# Patient Record
Sex: Female | Born: 1990 | Race: Black or African American | Hispanic: No | Marital: Single | Smoking: Never smoker
Health system: Southern US, Community
[De-identification: ages and names within clinical notes are randomized; demographics above are authoritative.]

## PROBLEM LIST (undated history)

## (undated) DIAGNOSIS — I456 Pre-excitation syndrome: Secondary | ICD-10-CM

---

## 2012-02-16 HISTORY — PX: WISDOM TOOTH EXTRACTION: SHX21

## 2018-01-09 ENCOUNTER — Telehealth: Payer: Self-pay

## 2018-01-09 NOTE — Telephone Encounter (Signed)
SENT REFERRAL TO SCHEDULING AND FILED NOTES 

## 2018-01-18 ENCOUNTER — Ambulatory Visit: Payer: PPO | Admitting: Cardiology

## 2018-01-18 ENCOUNTER — Encounter: Payer: Self-pay | Admitting: Cardiology

## 2018-01-18 VITALS — BP 122/66 | HR 78 | Ht 66.0 in | Wt 174.0 lb

## 2018-01-18 DIAGNOSIS — R011 Cardiac murmur, unspecified: Secondary | ICD-10-CM

## 2018-01-18 DIAGNOSIS — Z1329 Encounter for screening for other suspected endocrine disorder: Secondary | ICD-10-CM

## 2018-01-18 DIAGNOSIS — R079 Chest pain, unspecified: Secondary | ICD-10-CM

## 2018-01-18 DIAGNOSIS — I456 Pre-excitation syndrome: Secondary | ICD-10-CM | POA: Diagnosis not present

## 2018-01-18 DIAGNOSIS — Z13220 Encounter for screening for lipoid disorders: Secondary | ICD-10-CM

## 2018-01-18 NOTE — Patient Instructions (Signed)
Medication Instructions:  Your physician recommends that you continue on your current medications as directed. Please refer to the Current Medication list given to you today.  If you need a refill on your cardiac medications before your next appointment, please call your pharmacy.   Lab work: Your physician recommends that you have the following labs drawn: BMP, CBC, TSH, liver and lipid panel to be done today.  If you have labs (blood work) drawn today and your tests are completely normal, you will receive your results only by: Marland Kitchen. MyChart Message (if you have MyChart) OR . A paper copy in the mail If you have any lab test that is abnormal or we need to change your treatment, we will call you to review the results.  Testing/Procedures: Your physician has requested that you have an echocardiogram. Echocardiography is a painless test that uses sound waves to create images of your heart. It provides your doctor with information about the size and shape of your heart and how well your heart's chambers and valves are working. This procedure takes approximately one hour. There are no restrictions for this procedure.  Your physician has requested that you have an exercise tolerance test. For further information please visit [email or web address]. Please also follow instruction sheet, as given.  Follow-Up: At [facility], you and your health needs are our priority.  As part of our continuing mission to provide you with exceptional heart care, we have created designated Provider Care Teams.  These Care Teams include your primary Cardiologist (physician) and Advanced Practice Providers (APPs -  Physician Assistants and Nurse Practitioners) who all work together to provide you with the care you need, when you need it.  You will need a follow up appointment in 6 months.  Please call our office 2 months in advance to schedule this appointment.  You may see another member of our [facility] [place] Team  [place]: Gypsy Balsamobert Krasowski, MD . Norman HerrlichBrian Munley, MD  Any Other Special Instructions Will Be Listed Below (If Applicable).

## 2018-01-18 NOTE — Addendum Note (Signed)
Addended by: Carren RangGIPSON, Jvion Turgeon on: 01/18/2018 11:32 AM   Modules accepted: Orders

## 2018-01-18 NOTE — Progress Notes (Signed)
Cardiology Office Note:    Date:  01/18/2018   ID:  Kaitlin Kim, DOB 1990-11-07, MRN [id number]  PCP:  Patient, No Pcp Per  Cardiologist:  Garwin Brothersajan R , MD   Referring MD: Princella Pellegrinieed, Justin, PA    ASSESSMENT:    1. Chest pain, unspecified type   2. Cardiac murmur   3. Wolff-Parkinson-White (WPW) pattern seen on electrocardiography    PLAN:    In order of problems listed above:  1. I discussed my findings with the patient at extensive length and reassured her. 2. In view of her significant symptoms we will do a exercise treadmill test to reassure her.  Echocardiogram will be done to assess murmur heard on auscultation. 3. I discussed with her that she has a delta wave on her EKG suggestive of Wolff-Parkinson-White type of an EKG.  She has never had palpitations and I asked her about this extensively and I am convinced that she has had no issues with tachyarrhythmias based on her history. 4. Patient will be seen in follow-up appointment in 6 months or earlier if the patient has any concerns 5. She knows to go to the nearest emergency room for any concerning symptoms.   Medication Adjustments/Labs and Tests Ordered: Current medicines are reviewed at length with the patient today.  Concerns regarding medicines are outlined above.  No orders of the defined types were placed in this encounter.  No orders of the defined types were placed in this encounter.    History of Present Illness:    Kaitlin FearsRebekah Lai is a 27 y.o. female who is being seen today for the evaluation of chest pain at the request of Princella PellegriniReed, Justin, [place].  Patient is a pleasant 27 year old female.  She has no significant past medical history.  She mentions to me that she has had an episode of chest pain while at church.  She says she was just sitting down and not doing much and had a significant episode of chest pain no radiation to the neck or to the arms no shortness of breath she went to the urgent care center and was  evaluated and released.  I am awaiting those records from that place.  No orthopnea or PND.  She is an active lady.  She does not exercise on a regular basis.  With activities of routine living she has no symptoms.  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  History reviewed. No pertinent past medical history.  Past Surgical History:  Procedure Laterality Date  . WISDOM TOOTH EXTRACTION  2014    Current Medications: Current Meds  Medication Sig  . buPROPion (WELLBUTRIN XL) 150 MG 24 hr tablet Take 1 tablet by mouth daily.  . Hydroxyquinoline Sulfate POWD Apply 4 % topically as needed.  . Multiple Vitamin (MULTIVITAMIN) capsule Take 1 capsule by mouth daily.  . Omega-3 Fatty Acids (OMEGA-3 FISH OIL PO) Take 2 capsules by mouth daily.  Marland Kitchen. tretinoin (RETIN-A) 0.05 % cream Apply 1 application topically as needed.     Allergies:   Patient has no known allergies.   Social History   Socioeconomic History  . Marital status: Single    Spouse name: Not on file  . Number of children: Not on file  . Years of education: Not on file  . Highest education level: Not on file  Occupational History  . Not on file  Social Needs  . Financial resource strain: Not on file  . Food insecurity:    Worry: Not  on file    Inability: Not on file  . Transportation needs:    Medical: Not on file    Non-medical: Not on file  Tobacco Use  . Smoking status: Never Smoker  . Smokeless tobacco: Never Used  Substance and Sexual Activity  . Alcohol use: Not on file  . Drug use: Not on file  . Sexual activity: Not on file  Lifestyle  . Physical activity:    Days per week: Not on file    Minutes per session: Not on file  . Stress: Not on file  Relationships  . Social connections:    Talks on phone: Not on file    Gets together: Not on file    Attends religious service: Not on file    Active member of club or organization: Not on file    Attends meetings of clubs or organizations: Not on file      Relationship status: Not on file  Other Topics Concern  . Not on file  Social History Narrative  . Not on file     Family History: The patient's family history includes Prostate cancer in her paternal grandfather.  ROS:   Please see the history of present illness.    All other systems reviewed and are negative.  EKGs/Labs/Other Studies Reviewed:    The following studies were reviewed today: EKG reveals sinus rhythm with delta wave suggestive of WPW.   Recent Labs: No results found for requested labs within last 8760 hours.  Recent Lipid Panel No results found for: CHOL, TRIG, HDL, CHOLHDL, VLDL, LDLCALC, LDLDIRECT  Physical Exam:    VS:  BP 122/66 (BP Location: Right Arm, Patient Position: Sitting, Cuff Size: Normal)   Pulse 78   Ht 5\' 6"  (1.676 m)   Wt 174 lb (78.9 kg)   SpO2 97%   BMI 28.08 kg/m     Wt Readings from Last 3 Encounters:  01/18/18 174 lb (78.9 kg)     GEN: Patient is in no acute distress HEENT: Normal NECK: No JVD; No carotid bruits LYMPHATICS: No lymphadenopathy CARDIAC: S1 S2 regular, 2/6 systolic murmur at the apex. RESPIRATORY:  Clear to auscultation without rales, wheezing or rhonchi  ABDOMEN: Soft, non-tender, non-distended MUSCULOSKELETAL:  No edema; No deformity  SKIN: Warm and dry NEUROLOGIC:  Alert and oriented x 3 PSYCHIATRIC:  Normal affect    Signed, Garwin Brothers, MD  01/18/2018 10:53 AM    [facility]

## 2018-01-19 ENCOUNTER — Telehealth: Payer: Self-pay

## 2018-01-19 LAB — LIPID PANEL
Chol/HDL Ratio: 3 ratio (ref 0.0–4.4)
Cholesterol, Total: 171 mg/dL (ref 100–199)
HDL: 57 mg/dL (ref >39)
LDL CALC: 104 mg/dL — AB (ref 0–99)
Triglycerides: 52 mg/dL (ref 0–149)
VLDL Cholesterol Cal: 10 mg/dL (ref 5–40)

## 2018-01-19 LAB — BASIC METABOLIC PANEL
BUN/Creatinine Ratio: 16 (ref 9–23)
BUN: 13 mg/dL (ref 6–20)
CO2: 22 mmol/L (ref 20–29)
Calcium: 9.8 mg/dL (ref 8.7–10.2)
Chloride: 104 mmol/L (ref 96–106)
Creatinine, Ser: 0.79 mg/dL (ref 0.57–1.00)
GFR calc Af Amer: 119 mL/min/{1.73_m2} (ref >59)
GFR calc non Af Amer: 104 mL/min/{1.73_m2} (ref >59)
GLUCOSE: 82 mg/dL (ref 65–99)
Potassium: 4.2 mmol/L (ref 3.5–5.2)
Sodium: 138 mmol/L (ref 134–144)

## 2018-01-19 LAB — CBC WITH DIFFERENTIAL/PLATELET
Basophils Absolute: 0 10*3/uL (ref 0.0–0.2)
Basos: 1 %
EOS (ABSOLUTE): 0.1 10*3/uL (ref 0.0–0.4)
Eos: 2 %
HEMATOCRIT: 39 % (ref 34.0–46.6)
HEMOGLOBIN: 12.2 g/dL (ref 11.1–15.9)
Immature Grans (Abs): 0 10*3/uL (ref 0.0–0.1)
Immature Granulocytes: 0 %
Lymphocytes Absolute: 1.9 10*3/uL (ref 0.7–3.1)
Lymphs: 48 %
MCH: 22.8 pg — ABNORMAL LOW (ref 26.6–33.0)
MCHC: 31.3 g/dL — ABNORMAL LOW (ref 31.5–35.7)
MCV: 73 fL — ABNORMAL LOW (ref 79–97)
Monocytes Absolute: 0.3 10*3/uL (ref 0.1–0.9)
Monocytes: 8 %
Neutrophils Absolute: 1.6 10*3/uL (ref 1.4–7.0)
Neutrophils: 41 %
Platelets: 263 10*3/uL (ref 150–450)
RBC: 5.35 x10E6/uL — ABNORMAL HIGH (ref 3.77–5.28)
RDW: 13.8 % (ref 12.3–15.4)
WBC: 3.9 10*3/uL (ref 3.4–10.8)

## 2018-01-19 LAB — TSH: TSH: 1.39 u[IU]/mL (ref 0.450–4.500)

## 2018-01-19 LAB — HEPATIC FUNCTION PANEL
ALT: 17 IU/L (ref 0–32)
AST: 19 IU/L (ref 0–40)
Albumin: 4.5 g/dL (ref 3.5–5.5)
Alkaline Phosphatase: 51 IU/L (ref 39–117)
BILIRUBIN, DIRECT: 0.07 mg/dL (ref 0.00–0.40)
Bilirubin Total: 0.2 mg/dL (ref 0.0–1.2)
Total Protein: 7.6 g/dL (ref 6.0–8.5)

## 2018-01-19 NOTE — Telephone Encounter (Signed)
Called patient and left detailed voice message on phone regarding lab results. 

## 2018-02-02 ENCOUNTER — Ambulatory Visit (INDEPENDENT_AMBULATORY_CARE_PROVIDER_SITE_OTHER): Payer: PPO

## 2018-02-02 ENCOUNTER — Ambulatory Visit (HOSPITAL_COMMUNITY): Payer: PPO | Attending: Cardiology

## 2018-02-02 DIAGNOSIS — I456 Pre-excitation syndrome: Secondary | ICD-10-CM | POA: Insufficient documentation

## 2018-02-02 DIAGNOSIS — R079 Chest pain, unspecified: Secondary | ICD-10-CM | POA: Insufficient documentation

## 2018-02-02 DIAGNOSIS — R011 Cardiac murmur, unspecified: Secondary | ICD-10-CM

## 2018-02-02 LAB — EXERCISE TOLERANCE TEST
CSEPED: 9 min
Estimated workload: 10.8 METS
Exercise duration (sec): 30 s
MPHR: 194 {beats}/min
Peak HR: 179 {beats}/min
Percent HR: 92 %
RPE: 179
Rest HR: 92 {beats}/min

## 2018-02-02 MED ORDER — PERFLUTREN LIPID MICROSPHERE
1.0000 mL | INTRAVENOUS | Status: AC | PRN
  Administered 2018-02-02: 2 mL via INTRAVENOUS

## 2018-02-03 ENCOUNTER — Telehealth: Payer: Self-pay

## 2018-02-03 NOTE — Telephone Encounter (Signed)
Patient called and notified of test results. 

## 2018-02-03 NOTE — Telephone Encounter (Signed)
-----   Message from Rajan R Revankar, MD sent at 02/03/2018  8:18 AM EST ----- The results of the study is unremarkable. Please inform patient. I will discuss in detail at next appointment. Cc  primary care/referring physician Rajan R Revankar, MD 02/03/2018 8:18 AM 

## 2018-06-02 ENCOUNTER — Telehealth: Payer: Self-pay | Admitting: Cardiology

## 2018-06-02 NOTE — Telephone Encounter (Signed)
Pt called [employer] Dept,  because she got a bill from her lab visit. After contacting her insurance company, the [employer] advised her to contact the office to have the coding changed. Once the coding was changed, the bill would be paid by the insurance company  [employer] can be reached at [phone] M-F Ref# [id number] Reverification of Diagnosis

## 2018-06-05 NOTE — Telephone Encounter (Signed)
4/20-called pt,lvm to call [phone]

## 2018-06-12 ENCOUNTER — Telehealth: Payer: Self-pay

## 2018-06-12 NOTE — Telephone Encounter (Signed)
YOUR CARDIOLOGY TEAM HAS ARRANGED FOR AN E-VISIT FOR YOUR APPOINTMENT - PLEASE REVIEW IMPORTANT INFORMATION BELOW SEVERAL DAYS PRIOR TO YOUR APPOINTMENT  Due to the recent COVID-19 pandemic, we are transitioning in-person office visits to tele-medicine visits in an effort to decrease unnecessary exposure to our patients, their families, and staff. These visits are billed to your insurance just like a normal visit is. We also encourage you to sign up for MyChart if you have not already done so. You will need a smartphone if possible. For patients that do not have this, we can still complete the visit using a regular telephone but do prefer a smartphone to enable video when possible. You may have a family member that lives with you that can help. If possible, we also ask that you have a blood pressure cuff and scale at home to measure your blood pressure, heart rate and weight prior to your scheduled appointment. Patients with clinical needs that need an in-person evaluation and testing will still be able to come to the office if absolutely necessary. If you have any questions, feel free to call our office.   YOUR PROVIDER WILL BE USING THE FOLLOWING PLATFORM TO COMPLETE YOUR VISIT: Doxy.Me   . IF USING DOXIMITY or DOXY.ME - The staff will give you instructions on receiving your link to join the meeting the day of your visit.    2-3 DAYS BEFORE YOUR APPOINTMENT  You will receive a telephone call from one of our [facility] team members - your caller ID may say "Unknown caller." If this is a video visit, we will walk you through how to get the video launched on your phone. We will remind you check your blood pressure, heart rate and weight prior to your scheduled appointment. If you have an Apple Watch or Kardia, please upload any pertinent ECG strips the day before or morning of your appointment to MyChart. Our staff will also make sure you have reviewed the consent and agree to move forward with your  scheduled tele-health visit.    THE DAY OF YOUR APPOINTMENT  Approximately 15 minutes prior to your scheduled appointment, you will receive a telephone call from one of HeartCare team - your caller ID may say "Unknown caller."  Our staff will confirm medications, vital signs for the day and any symptoms you may be experiencing. Please have this information available prior to the time of visit start. It may also be helpful for you to have a pad of paper and pen handy for any instructions given during your visit. They will also walk you through joining the smartphone meeting if this is a video visit.    CONSENT FOR TELE-HEALTH VISIT - PLEASE REVIEW  I hereby voluntarily request, consent and authorize [facility] and its employed or contracted physicians, physician assistants, nurse practitioners or other licensed health care professionals (the Practitioner), to provide me with telemedicine health care services (the "Services") as deemed necessary by the treating Practitioner. I acknowledge and consent to receive the Services by the Practitioner via telemedicine. I understand that the telemedicine visit will involve communicating with the Practitioner through live audiovisual communication technology and the disclosure of certain medical information by electronic transmission. I acknowledge that I have been given the opportunity to request an in-person assessment or other available alternative prior to the telemedicine visit and am voluntarily participating in the telemedicine visit.  I understand that I have the right to withhold or withdraw my consent to the use of telemedicine in   the course of my care at any time, without affecting my right to future care or treatment, and that the Practitioner or I may terminate the telemedicine visit at any time. I understand that I have the right to inspect all information obtained and/or recorded in the course of the telemedicine visit and may receive copies of  available information for a reasonable fee.  I understand that some of the potential risks of receiving the Services via telemedicine include:  . Delay or interruption in medical evaluation due to technological equipment failure or disruption; . Information transmitted may not be sufficient (e.g. poor resolution of images) to allow for appropriate medical decision making by the Practitioner; and/or  . In rare instances, security protocols could fail, causing a breach of personal health information.  Furthermore, I acknowledge that it is my responsibility to provide information about my medical history, conditions and care that is complete and accurate to the best of my ability. I acknowledge that Practitioner's advice, recommendations, and/or decision may be based on factors not within their control, such as incomplete or inaccurate data provided by me or distortions of diagnostic images or specimens that may result from electronic transmissions. I understand that the practice of medicine is not an exact science and that Practitioner makes no warranties or guarantees regarding treatment outcomes. I acknowledge that I will receive a copy of this consent concurrently upon execution via email to the email address I last provided but may also request a printed copy by calling the office of [facility].    I understand that my insurance will be billed for this visit.   I have read or had this consent read to me. . I understand the contents of this consent, which adequately explains the benefits and risks of the Services being provided via telemedicine.  . I have been provided ample opportunity to ask questions regarding this consent and the Services and have had my questions answered to my satisfaction. . I give my informed consent for the services to be provided through the use of telemedicine in my medical care  By participating in this telemedicine visit I agree to the above.  

## 2018-06-13 ENCOUNTER — Telehealth: Payer: Self-pay | Admitting: Cardiology

## 2018-06-13 NOTE — Telephone Encounter (Signed)
This patient has WPW on EKG and otherwise no evidence of heart failure, structural heart disease or CAD.  If she would like to switch providers she would be more appropriate for EP than me.

## 2018-06-13 NOTE — Telephone Encounter (Signed)
Dr.Revankar please advise?

## 2018-06-13 NOTE — Telephone Encounter (Signed)
Dr  Duke Salvia please advise?

## 2018-06-13 NOTE — Telephone Encounter (Signed)
  Patient would like to go from Dr Tomie China to start seeing Dr Duke Salvia

## 2018-06-14 NOTE — Telephone Encounter (Signed)
Left message to call back  

## 2018-06-14 NOTE — Telephone Encounter (Signed)
Agree. EP would be better

## 2018-06-15 NOTE — Telephone Encounter (Signed)
° °  Patient returning call. EP appointment needed. Patient advised she would be contacted to schedule EP appointment

## 2018-06-16 ENCOUNTER — Telehealth: Payer: PPO | Admitting: Cardiology

## 2018-06-21 ENCOUNTER — Telehealth: Payer: Self-pay | Admitting: *Deleted

## 2018-06-21 NOTE — Telephone Encounter (Signed)
Virtual Visit Pre-Appointment Phone Call  "(Name), I am calling you today to discuss your upcoming appointment. We are currently trying to limit exposure to the virus that causes COVID-19 by seeing patients at home rather than in the office."  1. "What is the BEST phone number to call the day of the visit?" - include this in appointment notes  2. "Do you have or have access to (through a family member/friend) a smartphone with video capability that we can use for your visit?" a. If yes - list this number in appt notes as "cell" (if different from BEST phone #) and list the appointment type as a VIDEO visit in appointment notes b. If no - list the appointment type as a PHONE visit in appointment notes  3. Confirm consent - "In the setting of the current Covid19 crisis, you are scheduled for a (phone or video) visit with your provider on (date) at (time).  Just as we do with many in-office visits, in order for you to participate in this visit, we must obtain consent.  If you'd like, I can send this to your mychart (if signed up) or email for you to review.  Otherwise, I can obtain your verbal consent now.  All virtual visits are billed to your insurance company just like a normal visit would be.  By agreeing to a virtual visit, we'd like you to understand that the technology does not allow for your provider to perform an examination, and thus may limit your provider's ability to fully assess your condition. If your provider identifies any concerns that need to be evaluated in person, we will make arrangements to do so.  Finally, though the technology is pretty good, we cannot assure that it will always work on either your or our end, and in the setting of a video visit, we may have to convert it to a phone-only visit.  In either situation, we cannot ensure that we have a secure connection.  Are you willing to proceed?" STAFF: Did the patient verbally acknowledge consent to telehealth visit? Document  YES/NO here: YES  4. Advise patient to be prepared - "Two hours prior to your appointment, go ahead and check your blood pressure, pulse, oxygen saturation, and your weight (if you have the equipment to check those) and write them all down. When your visit starts, your provider will ask you for this information. If you have an Apple Watch or Kardia device, please plan to have heart rate information ready on the day of your appointment. Please have a pen and paper handy nearby the day of the visit as well."  5. Give patient instructions for MyChart download to smartphone OR Doximity/Doxy.me as below if video visit (depending on what platform provider is using)  6. Inform patient they will receive a phone call 15 minutes prior to their appointment time (may be from unknown caller ID) so they should be prepared to answer    TELEPHONE CALL NOTE  Kaitlin Kim has been deemed a candidate for a follow-up tele-health visit to limit community exposure during the Covid-19 pandemic. I spoke with the patient via phone to ensure availability of phone/video source, confirm preferred email & phone number, and discuss instructions and expectations.  I reminded Kaitlin Kim to be prepared with any vital sign and/or heart rhythm information that could potentially be obtained via home monitoring, at the time of her visit. I reminded Kaitlin Kim to expect a phone call prior to her visit.  Baird Lyons, RN 06/21/2018 3:45 PM   INSTRUCTIONS FOR DOWNLOADING THE MYCHART APP TO SMARTPHONE  - The patient must first make sure to have activated MyChart and know their login information - If Apple, go to [employer] and type in MyChart in the search bar and download the app. If Android, ask patient to go to [employer] and type in [place] in the search bar and download the app. The app is free but as with any other app downloads, their phone may require them to verify saved payment information or  Apple/Android password.  - The patient will need to then log into the app with their MyChart username and password, and select [facility] as their healthcare provider to link the account. When it is time for your visit, go to the MyChart app, find appointments, and click Begin Video Visit. Be sure to Select Allow for your device to access the Microphone and Camera for your visit. You will then be connected, and your provider will be with you shortly.  **If they have any issues connecting, or need assistance please contact MyChart service desk [phone])**  **If using a computer, in order to ensure the best quality for their visit they will need to use either of the following Internet Browsers: [employer] or Google Chrome**  IF USING DOXIMITY or DOXY.ME - The patient will receive a link just prior to their visit by text.     FULL LENGTH CONSENT FOR TELE-HEALTH VISIT   I hereby voluntarily request, consent and authorize [facility] and its employed or contracted physicians, physician assistants, nurse practitioners or other licensed health care professionals (the Practitioner), to provide me with telemedicine health care services (the "Services") as deemed necessary by the treating Practitioner. I acknowledge and consent to receive the Services by the Practitioner via telemedicine. I understand that the telemedicine visit will involve communicating with the Practitioner through live audiovisual communication technology and the disclosure of certain medical information by electronic transmission. I acknowledge that I have been given the opportunity to request an in-person assessment or other available alternative prior to the telemedicine visit and am voluntarily participating in the telemedicine visit.  I understand that I have the right to withhold or withdraw my consent to the use of telemedicine in the course of my care at any time, without affecting my right to future care  or treatment, and that the Practitioner or I may terminate the telemedicine visit at any time. I understand that I have the right to inspect all information obtained and/or recorded in the course of the telemedicine visit and may receive copies of available information for a reasonable fee.  I understand that some of the potential risks of receiving the Services via telemedicine include:  Marland Kitchen Delay or interruption in medical evaluation due to technological equipment failure or disruption; . Information transmitted may not be sufficient (e.g. poor resolution of images) to allow for appropriate medical decision making by the Practitioner; and/or  . In rare instances, security protocols could fail, causing a breach of personal health information.  Furthermore, I acknowledge that it is my responsibility to provide information about my medical history, conditions and care that is complete and accurate to the best of my ability. I acknowledge that Practitioner's advice, recommendations, and/or decision may be based on factors not within their control, such as incomplete or inaccurate data provided by me or distortions of diagnostic images or specimens that may result from electronic transmissions. I understand that  the practice of medicine is not an exact science and that Practitioner makes no warranties or guarantees regarding treatment outcomes. I acknowledge that I will receive a copy of this consent concurrently upon execution via email to the email address I last provided but may also request a printed copy by calling the office of [place].    I understand that my insurance will be billed for this visit.   I have read or had this consent read to me. . I understand the contents of this consent, which adequately explains the benefits and risks of the Services being provided via telemedicine.  . I have been provided ample opportunity to ask questions regarding this consent and the Services and have had  my questions answered to my satisfaction. . I give my informed consent for the services to be provided through the use of telemedicine in my medical care  By participating in this telemedicine visit I agree to the above.

## 2018-06-21 NOTE — Telephone Encounter (Signed)
Patient has appointment tomorrow with EP

## 2018-06-22 ENCOUNTER — Telehealth: Payer: Self-pay | Admitting: Cardiology

## 2018-06-22 ENCOUNTER — Encounter: Payer: PPO | Admitting: Cardiology

## 2018-06-22 ENCOUNTER — Other Ambulatory Visit: Payer: Self-pay

## 2018-06-22 NOTE — Progress Notes (Signed)
This encounter was created in error - please disregard.

## 2018-06-22 NOTE — Telephone Encounter (Signed)
Rodman Key, scheduler, informed pt at 4:10 pm that I would contact her next week to reschedule.

## 2018-06-22 NOTE — Telephone Encounter (Signed)
New Message    Pt said she has called twice now and her appt was over an hour ago and still has not gotten a call back    Please call

## 2018-06-23 ENCOUNTER — Emergency Department (HOSPITAL_COMMUNITY)
Admission: EM | Admit: 2018-06-23 | Discharge: 2018-06-23 | Disposition: A | Discharge status: Home / Self Care | Payer: PPO | Attending: Emergency Medicine | Admitting: Emergency Medicine

## 2018-06-23 ENCOUNTER — Other Ambulatory Visit: Payer: Self-pay

## 2018-06-23 ENCOUNTER — Encounter (HOSPITAL_COMMUNITY): Payer: Self-pay | Admitting: Emergency Medicine

## 2018-06-23 ENCOUNTER — Emergency Department (HOSPITAL_COMMUNITY): Payer: PPO

## 2018-06-23 DIAGNOSIS — Z79899 Other long term (current) drug therapy: Secondary | ICD-10-CM | POA: Diagnosis not present

## 2018-06-23 DIAGNOSIS — I456 Pre-excitation syndrome: Secondary | ICD-10-CM | POA: Diagnosis not present

## 2018-06-23 DIAGNOSIS — R079 Chest pain, unspecified: Secondary | ICD-10-CM

## 2018-06-23 LAB — CBC
HCT: 39.2 % (ref 36.0–46.0)
Hemoglobin: 12.3 g/dL (ref 12.0–15.0)
MCH: 23.1 pg — ABNORMAL LOW (ref 26.0–34.0)
MCHC: 31.4 g/dL (ref 30.0–36.0)
MCV: 73.5 fL — ABNORMAL LOW (ref 80.0–100.0)
Platelets: 212 10*3/uL (ref 150–400)
RBC: 5.33 MIL/uL — ABNORMAL HIGH (ref 3.87–5.11)
RDW: 13.8 % (ref 11.5–15.5)
WBC: 7.1 10*3/uL (ref 4.0–10.5)
nRBC: 0 % (ref 0.0–0.2)

## 2018-06-23 LAB — BASIC METABOLIC PANEL
Anion gap: 10 (ref 5–15)
BUN: 10 mg/dL (ref 6–20)
CO2: 26 mmol/L (ref 22–32)
Calcium: 9.5 mg/dL (ref 8.9–10.3)
Chloride: 101 mmol/L (ref 98–111)
Creatinine, Ser: 0.98 mg/dL (ref 0.44–1.00)
GFR calc Af Amer: >60 mL/min (ref >60)
GFR calc non Af Amer: >60 mL/min (ref >60)
Glucose, Bld: 88 mg/dL (ref 70–99)
Potassium: 3.3 mmol/L — ABNORMAL LOW (ref 3.5–5.1)
Sodium: 137 mmol/L (ref 135–145)

## 2018-06-23 LAB — I-STAT BETA HCG BLOOD, ED (MC, WL, AP ONLY): I-stat hCG, quantitative: <5 m[IU]/mL (ref <5)

## 2018-06-23 LAB — TROPONIN I: Troponin I: <0.03 ng/mL (ref <0.03)

## 2018-06-23 MED ORDER — SODIUM CHLORIDE 0.9% FLUSH: 3.0000 mL | Freq: Once | INTRAVENOUS | Status: DC

## 2018-06-23 NOTE — ED Provider Notes (Signed)
[facility] Provider Note   CSN: [id number] Arrival date & time: 06/23/18  1802    History   Chief Complaint Chief Complaint  Patient presents with  . Chest Pain    HPI Kaitlin Kim is a 28 y.o. 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 Patient states pain has been occurring since November 2019. Saw cardiology, EKG w/ concern for WPW, had echo & stress test that were reassuring in December 2019. States pain seemed to ease off some but never truly went away then seemed to worsen in March of 2020.  Pain is located to the L chest and seems to go to the L forearm at times, described as an aching sensation that waxes/wanes without alleviating/aggravating factors. No change w/ deep breath, positions, or exertion.  She mentions that she has had a couple episodes of palpitations, maybe 1/week, where it feels like her heart is beating quickly, she becomes anxious, she checks her Fitbit and has noted that her heart rate has been in the 150s during these episodes.  Episodes are brief in nature without alleviating or aggravating factors.  They have not been associated with lightheadedness, dizziness, or syncopal episode. States overall her symptoms have been worse over the past 1 week.  Denies fever, chills, cough, dyspnea, nausea, vomiting, diaphoresis, passing out, leg swelling, hemoptysis, recent surgery/trauma, recent long travel, hormone use, personal hx of cancer, or hx of DVT/PE.      HPI  No past medical history on file.  Patient Active Problem List   Diagnosis Date Noted  . Chest pain 01/18/2018  . Cardiac murmur 01/18/2018  . Wolff-Parkinson-White (WPW) pattern seen on electrocardiography 01/18/2018    Past Surgical History:  Procedure Laterality Date  . WISDOM TOOTH EXTRACTION  2014     OB History   No obstetric history on file.      Home Medications    Prior to Admission medications   Medication  Sig Start Date End Date Taking? Authorizing Provider  buPROPion (WELLBUTRIN XL) 150 MG 24 hr tablet Take 1 tablet by mouth daily. 09/12/17   [provider]  Hydroxyquinoline Sulfate POWD Apply 4 % topically as needed.    [provider]  Multiple Vitamin (MULTIVITAMIN) capsule Take 1 capsule by mouth daily.    [provider]  Omega-3 Fatty Acids (OMEGA-3 FISH OIL PO) Take 2 capsules by mouth daily.    [provider]  tretinoin (RETIN-A) 0.05 % cream Apply 1 application topically as needed.    [provider]    Family History Family History  Problem Relation Age of Onset  . Prostate cancer Paternal Grandfather     Social History Social History   Tobacco Use  . Smoking status: Never Smoker  . Smokeless tobacco: Never Used  Substance Use Topics  . Alcohol use: Yes    Comment: socially  . Drug use: Never     Allergies   Patient has no known allergies.   Review of Systems Review of Systems  Constitutional: Negative for chills and fever.  Respiratory: Negative for shortness of breath.   Cardiovascular: Positive for chest pain and palpitations. Negative for leg swelling.  Gastrointestinal: Negative for abdominal pain, nausea and vomiting.  Musculoskeletal: Positive for myalgias (L arm ).  Neurological: Negative for dizziness, syncope, weakness, light-headedness and numbness.  All other systems reviewed and are negative.  Physical Exam Updated Vital Signs BP 116/64 (BP Location: Right  Arm)   Pulse 81   Temp 98.2 F (36.8 C) (Oral)   Resp 20   Ht  (1.676 m)   Wt 80.3 kg   LMP 06/14/2018 (Approximate)   SpO2 99%   BMI 28.57 kg/m   Physical Exam Vitals signs and nursing note reviewed.  Constitutional:      General: She is not in acute distress.    Appearance: She is well-developed. She is not toxic-appearing.  HENT:     Head: Normocephalic and atraumatic.  Eyes:     General:        Right eye: No discharge.         Left eye: No discharge.     Conjunctiva/sclera: Conjunctivae normal.  Neck:     Musculoskeletal: Neck supple.  Cardiovascular:     Rate and Rhythm: Normal rate and regular rhythm.     Pulses:          Radial pulses are 2+ on the right side and 2+ on the left side.       Dorsalis pedis pulses are 2+ on the right side and 2+ on the left side.  Pulmonary:     Effort: Pulmonary effort is normal. No respiratory distress.     Breath sounds: Normal breath sounds. No wheezing, rhonchi or rales.  Chest:     Chest wall: No tenderness.  Abdominal:     General: There is no distension.     Palpations: Abdomen is soft.     Tenderness: There is no abdominal tenderness.  Musculoskeletal:     Right lower leg: She exhibits no tenderness. No edema.     Left lower leg: She exhibits no tenderness. No edema.  Skin:    General: Skin is warm and dry.     Capillary Refill: Capillary refill takes less than 2 seconds.     Findings: No rash.  Neurological:     General: No focal deficit present.     Mental Status: She is alert.     Comments: Clear speech.   Psychiatric:        Behavior: Behavior normal.    ED Treatments / Results  Labs (all labs ordered are listed, but only abnormal results are displayed) Labs Reviewed  CBC - Abnormal; Notable for the following components:      Result Value   RBC 5.33 (*)    MCV 73.5 (*)    MCH 23.1 (*)    All other components within normal limits  BASIC METABOLIC PANEL  TROPONIN I  I-STAT BETA HCG BLOOD, ED ([facility], [facility], [facility] ONLY)    EKG EKG Interpretation  Date/Time:  Friday Jun 23 2018 18:15:38 EDT Ventricular Rate:  71 PR Interval:  88 QRS Duration: 128 QT Interval:  410 QTC Calculation: 445 R Axis:   61 Text Interpretation:  Normal sinus rhythm Ventricular pre-excitation, WPW pattern type B Abnormal ECG No old tracing to compare Confirmed by Melene Plan [phone]) on 06/23/2018 6:22:38 PM  Radiology Dg Chest 2 View  Result Date: 06/23/2018 CLINICAL DATA:   Initial evaluation for acute chest discomfort. EXAM: CHEST - 2 VIEW COMPARISON:  None available. FINDINGS: The cardiac and mediastinal silhouettes are within normal limits. The lungs are normally inflated. No airspace consolidation, pleural effusion, or pulmonary edema is identified. There is no pneumothorax. No acute osseous abnormality identified. IMPRESSION: No active cardiopulmonary disease. Electronically Signed   By: Rise Mu M.D.   On: 06/23/2018 18:48   Procedures Procedures (including critical care time)  Medications Ordered in ED Medications  sodium chloride flush (NS) 0.9 % injection 3 mL (has no administration in time range)    Initial Impression / Assessment and Plan / ED Course  I have reviewed the triage vital signs and the nursing notes.  Pertinent labs & imaging results that were available during my care of the patient were reviewed by me and considered in my medical decision making (see chart for details).  Patient presents to the emergency department with complaints of waxing/waning chest pain which have been occurring since November 2019, eased off at the beginning of the year, returned over the past 2 months.  Nontoxic-appearing, no apparent distress, vitals WNL on initial assessment.  Patient has a benign physical exam.    CBC: No leukocytosis.  Hemoglobin hematocrit within normal limits.  MCV/MCH mildly decreased consistent with prior. BMP: Mild hypokalemia at 3.3, will recommend diet supplementation. Pregnancy test: Negative Troponin: Negative after several weeks of pain. (waxes/wanes- does not resolve).  EKG: Findings consistent with WPW. X-ray: Negative for acute abnormality.  Patient is PERC negative, doubt PE.  No widened mediastinum, symmetric pulses, doubt dissection.  Chest x-ray without pneumothorax, infiltrate, edema, or effusion.  No cardiomegaly noted.  Other than history of WPW, otherwise young healthy female, had stress test in November that  was reassuring, no STEMI, Trope negative, doubt ACS.  Chart review: 02/02/18: exercise tolerance test- no evidence of ischemia; echocardiogram w/ normal LV systolic & diastolic function  19:56:CONSULT: Discussed w/ Theodore Demark PA-C w/ cardiology-in agreement with discharge home with keeping of symptom diary, she will facilitate close follow-up early next week.  Patient expressed concern that she will be in [place] visiting family- I re-discussed w/ cardiology fellow who has informed me the office will be ensure to call her and set up follow up as previously discussed w/ Barrett PA-C.   I discussed results, treatment plan, need for follow-up, and return precautions with the patient. Provided opportunity for questions, patient confirmed understanding and is in agreement with plan.   Findings and plan of care discussed with supervising physician Dr. Adela Lank who is in agreement.   Final Clinical Impressions(s) / ED Diagnoses   Final diagnoses:  Chest pain, unspecified type    ED Discharge Orders    None       Cherly Anderson, PA-C 06/23/18 2127    Melene Plan, DO 06/23/18 2252

## 2018-06-23 NOTE — ED Notes (Signed)
Patient verbalizes understanding of discharge instructions. Opportunity for questioning and answers were provided. Armband removed by staff, pt discharged from ED ambulatory to home.  

## 2018-06-23 NOTE — Discharge Instructions (Signed)
You were seen in the emergency department today for chest pain. Your work-up in the emergency department has been overall reassuring. Your labs have been fairly normal and or similar to previous blood work you have had done. Your potassium was a bit low at 3.3- normal is 3.5-5.1- please follow attached diet guidelines. Your EKG and the enzyme we use to check your heart did not show an acute heart attack at this time. Your chest x-ray was normal. Your EKG did show WPW.   Please keep a symptom diary as we discussed.  Cardiology office will call you on Monday to make an appointment.. Return to the ER immediately should you experience any new or worsening symptoms including but not limited to return of pain, worsened pain, vomiting, shortness of breath, dizziness, persistent palpitations, lightheadedness, passing out, or any other concerns that you may have.

## 2018-06-23 NOTE — ED Triage Notes (Signed)
Patient reports intermittent chest discomfort that has increased in frequency and intensity over the last 2 weeks as well as tingling down L arm and "left side feels weird." Patient states she has seen a cardiologist in the past (hx heart murmur and WPW). Endorses slight shortness of breath with laying flat? But denies dizziness, N/V, cough, fevers/chills. Resp e/u, skin w/d. NAD noted in triage.

## 2018-06-26 NOTE — Telephone Encounter (Signed)
Melissa called pt and re-scheduled her for 5/14 w/ Camnitz.

## 2018-06-29 ENCOUNTER — Telehealth (INDEPENDENT_AMBULATORY_CARE_PROVIDER_SITE_OTHER): Payer: PPO | Admitting: Cardiology

## 2018-06-29 ENCOUNTER — Other Ambulatory Visit: Payer: Self-pay

## 2018-06-29 DIAGNOSIS — I456 Pre-excitation syndrome: Secondary | ICD-10-CM | POA: Diagnosis not present

## 2018-06-29 NOTE — Progress Notes (Signed)
Virtual Visit via Video Note   This visit type was conducted due to national recommendations for restrictions regarding the COVID-19 Pandemic (e.g. social distancing) in an effort to limit this patient's exposure and mitigate transmission in our community.  Due to her co-morbid illnesses, this patient is at least at moderate risk for complications without adequate follow up.  This format is felt to be most appropriate for this patient at this time.  All issues noted in this document were discussed and addressed.  A limited physical exam was performed with this format.  Please refer to the patient's chart for her consent to telehealth for [facility].   Date:  06/29/2018   ID:  Kaitlin Kim, DOB January 17, 1991, MRN [id number]  Patient Location: Home Provider Location: Home  PCP:  Patient, No Pcp Per  Cardiologist:  Revankar Electrophysiologist:  None   Evaluation Performed:  Consultation - Zenda Bourdon was referred by Belva Crome for the evaluation of WPW.  Chief Complaint:  referral  History of Present Illness:    Kaitlin Kim is a 28 y.o. female with Is a 28 year old female being referred due to ventricular preexcitation.  She is also been having episodes of chest pain.  On evaluation, it was noted that she had ventricular preexcitation as well.  She presented to the hospital 06/23/2018 with chest pain.  She was ruled out for coronary issues.  She has had an exercise treadmill test that was without ST changes.  She continues to have episodes of chest pain.  Her chest pain occurs at random times not associated with exertion.  She potentially has palpitations at the time of her chest discomfort.  The patient does not have symptoms concerning for COVID-19 infection (fever, chills, cough, or new shortness of breath).    No past medical history on file. Past Surgical History:  Procedure Laterality Date  . WISDOM TOOTH EXTRACTION  2014     Current Meds  Medication Sig  .  Multiple Vitamin (MULTIVITAMIN) capsule Take 1 capsule by mouth daily.  . Omega-3 Fatty Acids (OMEGA-3 FISH OIL PO) Take 2 capsules by mouth daily.     Allergies:   Patient has no known allergies.   Social History   Tobacco Use  . Smoking status: Never Smoker  . Smokeless tobacco: Never Used  Substance Use Topics  . Alcohol use: Yes    Comment: socially  . Drug use: Never     Family Hx: The patient's family history includes Prostate cancer in her paternal grandfather.  ROS:   Please see the history of present illness.     All other systems reviewed and are negative.   Prior CV studies:   The following studies were reviewed today:  ETT personally reviewed  Blood pressure demonstrated a normal response to exercise.  There was no ST segment deviation noted during stress.   1.  Good exercise tolerance.  2.  No evidence for ischemia by ST segment analysis.   TTE - Left ventricle: The cavity size was normal. Wall thickness was   normal. Systolic function was normal. The estimated ejection   fraction was in the range of 55% to 60%. Wall motion was normal;   there were no regional wall motion abnormalities. Left   ventricular diastolic function parameters were normal. - Pericardium, extracardiac: A trivial pericardial effusion was   identified.  Labs/Other Tests and Data Reviewed:    EKG:  An ECG dated 06/26/2018 was personally reviewed today and demonstrated:  Sinus rhythm, ventricular  preexcitation  Recent Labs: 01/18/2018: ALT 17; TSH 1.390 06/23/2018: BUN 10; Creatinine, Ser 0.98; Hemoglobin 12.3; Platelets 212; Potassium 3.3; Sodium 137   Recent Lipid Panel Lab Results  Component Value Date/Time   CHOL 171 01/18/2018 11:08 AM   TRIG 52 01/18/2018 11:08 AM   HDL 57 01/18/2018 11:08 AM   CHOLHDL 3.0 01/18/2018 11:08 AM   LDLCALC 104 (H) 01/18/2018 11:08 AM    Wt Readings from Last 3 Encounters:  06/23/18 177 lb (80.3 kg)  01/18/18 174 lb (78.9 kg)      Objective:    Vital Signs:  BP 130/76   Pulse 68   LMP 06/14/2018 (Approximate)    VITAL SIGNS:  reviewed GEN:  no acute distress EYES:  sclerae anicteric, EOMI - Extraocular Movements Intact RESPIRATORY:  normal respiratory effort, symmetric expansion CARDIOVASCULAR:  no peripheral edema SKIN:  no rash, lesions or ulcers. MUSCULOSKELETAL:  no obvious deformities. NEURO:  alert and oriented x 3, no obvious focal deficit PSYCH:  normal affect  ASSESSMENT & PLAN:    1. 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  I discussed with her possible options for treatment including medical management versus ablation.  She did say that she would like to think about this.  We did discuss risks of ablation which include bleeding, tamponade, heart block, stroke.  If she does wish to have medical management, she would likely benefit from flecainide and diltiazem. 2. Chest pain: Unclear as to the cause of her chest pain.  Chest pain could be due to SVT as a result of her WPW.  COVID-19 Education: The signs and symptoms of COVID-19 were discussed with the patient and how to seek care for testing (follow up with PCP or arrange E-visit).  The importance of social distancing was discussed today.  Time:   Today, I have spent 15 minutes with the patient with telehealth technology discussing the above problems.     Medication Adjustments/Labs and Tests Ordered: Current medicines are reviewed at length with the patient today.  Concerns regarding medicines are outlined above.   Tests Ordered: No orders of the defined types were placed in this encounter.   Medication Changes: No orders of the defined types were placed in this encounter.  Case discussed with primary cardiology  Disposition:  Follow up in 3 month(s)  Signed, Shalie Schremp Jorja LoaMartin Yeraldy Spike, MD  06/29/2018 2:01 PM    [facility]

## 2018-07-03 ENCOUNTER — Telehealth: Payer: Self-pay

## 2018-07-03 NOTE — Telephone Encounter (Signed)
Patient states she is fine and was able to reschedule her appt with Dr. Elberta Fortis for the 14th of May with no problems. RN advised that if [facility] can help in anyway to feel free to call. No further questions at this time.

## 2018-07-14 ENCOUNTER — Telehealth: Payer: Self-pay | Admitting: Cardiology

## 2018-07-14 NOTE — Telephone Encounter (Signed)
Lm for pt Informed that I responded to mychart message but if she still needed to speak to me to please call lthe office back.

## 2018-07-14 NOTE — Telephone Encounter (Signed)
New Message:    Pt says she needs to talk to the nurse or Dr Elberta Fortis. He had talked about doing a procedure. She wants to delay this at this time.She also have some other concerns that she needs to discuss.

## 2018-09-25 ENCOUNTER — Ambulatory Visit: Payer: PPO | Admitting: Cardiology

## 2018-10-30 ENCOUNTER — Encounter: Payer: Self-pay | Admitting: Cardiology

## 2018-10-30 ENCOUNTER — Other Ambulatory Visit: Payer: Self-pay

## 2018-10-30 ENCOUNTER — Ambulatory Visit (INDEPENDENT_AMBULATORY_CARE_PROVIDER_SITE_OTHER): Payer: PPO | Admitting: Cardiology

## 2018-10-30 VITALS — BP 116/66 | HR 75 | Ht 66.0 in | Wt 183.0 lb

## 2018-10-30 DIAGNOSIS — I456 Pre-excitation syndrome: Secondary | ICD-10-CM | POA: Diagnosis not present

## 2018-10-30 MED ORDER — FLECAINIDE ACETATE 100 MG PO TABS: 100.0000 mg | ORAL_TABLET | Freq: Two times a day (BID) | ORAL | 4 refills | Status: DC

## 2018-10-30 MED ORDER — DILTIAZEM HCL ER COATED BEADS 120 MG PO CP24: 120.0000 mg | ORAL_CAPSULE | Freq: Every day | ORAL | 3 refills | Status: DC

## 2018-10-30 NOTE — Addendum Note (Signed)
Addended by: Stanton Kidney on: 10/30/2018 03:41 PM   Modules accepted: Orders

## 2018-10-30 NOTE — Progress Notes (Signed)
Electrophysiology Office Note   Date:  10/30/2018   ID:  Kaitlin Kim, DOB 1991-02-04, MRN [id number]  PCP:  Patient, No Pcp Per  Cardiologist:  Revankar Primary Electrophysiologist:  Payam Gribble Meredith Leeds, MD    Chief Complaint: palpitatins   History of Present Illness: Kaitlin Kim is a 28 y.o. female who is being seen today for the evaluation of palpitations at the request of Sunny Schlein Revankar. Presenting today for electrophysiology evaluation.  She had previously been having episodes of chest pain.  ECG at the time showed evidence of ventricular preexcitation.  Today, she denies symptoms of palpitations, chest pain, shortness of breath, orthopnea, PND, lower extremity edema, claudication, dizziness, presyncope, syncope, bleeding, or neurologic sequela. The patient is tolerating medications without difficulties.  Since last being seen, she has had one further episode of palpitations.  They lasted for 3 to 4 minutes and were associated with chest discomfort.   History reviewed. No pertinent past medical history. Past Surgical History:  Procedure Laterality Date  . WISDOM TOOTH EXTRACTION  2014     Current Outpatient Medications  Medication Sig Dispense Refill  . Multiple Vitamin (MULTIVITAMIN) capsule Take 1 capsule by mouth daily.    . Omega-3 Fatty Acids (OMEGA-3 FISH OIL PO) Take 2 capsules by mouth daily.     No current facility-administered medications for this visit.     Allergies:   Patient has no known allergies.   Social History:  The patient  reports that she has never smoked. She has never used smokeless tobacco. She reports current alcohol use. She reports that she does not use drugs.   Family History:  The patient's family history includes Prostate cancer in her paternal grandfather.    ROS:  Please see the history of present illness.   Otherwise, review of systems is positive for none.   All other systems are reviewed and negative.    PHYSICAL EXAM:  VS:  BP 116/66   Pulse 75   Ht 5\' 6"  (1.676 m)   Wt 183 lb (83 kg)   SpO2 98%   BMI 29.54 kg/m  , BMI Body mass index is 29.54 kg/m. GEN: Well nourished, well developed, in no acute distress  HEENT: normal  Neck: no JVD, carotid bruits, or masses Cardiac: RRR; no murmurs, rubs, or gallops,no edema  Respiratory:  clear to auscultation bilaterally, normal work of breathing GI: soft, nontender, nondistended, + BS MS: no deformity or atrophy  Skin: warm and dry Neuro:  Strength and sensation are intact Psych: euthymic mood, full affect  EKG:  EKG is ordered today. Personal review of the ekg ordered shows sinus rhythm, ventricular preexcitation, rate 75   Recent Labs: 01/18/2018: ALT 17; TSH 1.390 06/23/2018: BUN 10; Creatinine, Ser 0.98; Hemoglobin 12.3; Platelets 212; Potassium 3.3; Sodium 137    Lipid Panel     Component Value Date/Time   CHOL 171 01/18/2018 1108   TRIG 52 01/18/2018 1108   HDL 57 01/18/2018 1108   CHOLHDL 3.0 01/18/2018 1108   LDLCALC 104 (H) 01/18/2018 1108     Wt Readings from Last 3 Encounters:  10/30/18 183 lb (83 kg)  06/23/18 177 lb (80.3 kg)  01/18/18 174 lb (78.9 kg)      Other studies Reviewed: Additional studies/ records that were reviewed today include: TTE 02/02/18  Review of the above records today demonstrates:  - Left ventricle: The cavity size was normal. Wall thickness was   normal. Systolic function was normal. The estimated ejection  fraction was in the range of 55% to 60%. Wall motion was normal;   there were no regional wall motion abnormalities. Left   ventricular diastolic function parameters were normal. - Pericardium, extracardiac: A trivial pericardial effusion was   identified.   ASSESSMENT AND PLAN:  1.  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  I spoke with her today about the possibility of ablation versus medical management.  At this point she is not  excited about ablation therapy.  We Aubry Rankin thus start her on flecainide 100 mg and diltiazem 120 mg.  We Annaliese Saez have her come back for an ECG in 10 days.    Current medicines are reviewed at length with the patient today.   The patient does not have concerns regarding her medicines.  The following changes were made today:  none  Labs/ tests ordered today include:  No orders of the defined types were placed in this encounter.    Disposition:   FU with Laiyla Slagel 3 months  Signed, Jamella Grayer Jorja LoaMartin Krisy Dix, MD  10/30/2018 3:05 PM     [facility] [address] [place] [address] [phone] (office) [phone] (fax)

## 2018-10-30 NOTE — Patient Instructions (Addendum)
Medication Instructions:  Your physician has recommended you make the following change in your medication:  1. START Flecainide 100 mg twice a day 2. START Diltiazem 120 mg once a day  * If you need a refill on your cardiac medications before your next appointment, please call your pharmacy.   Labwork: None ordered  Testing/Procedures: None ordered  Follow-Up: Your physician recommends that you schedule a follow-up appointment in: 10 days for nurse visit EKG in [place] -- this is scheduled for 11/15/18 @ 2:00 pm.  ([address])  Your physician recommends that you schedule a follow-up appointment in: 3 months with Dr. Elberta Fortis in the [facility] office.   Thank you for choosing [facility]!!   Dory Horn, RN [phone]  Any Other Special Instructions Will Be Listed Below (If Applicable).   Flecainide tablets What is this medicine? FLECAINIDE (FLEK a nide) is an antiarrhythmic drug. This medicine is used to prevent irregular heart rhythm. It can also slow down fast heartbeats called tachycardia. This medicine may be used for other purposes; ask your health care provider or pharmacist if you have questions. COMMON BRAND NAME(S): Tambocor What should I tell my health care provider before I take this medicine? They need to know if you have any of these conditions:  abnormal levels of potassium in the blood  heart disease including heart rhythm and heart rate problems  kidney or liver disease  recent heart attack  an unusual or allergic reaction to flecainide, local anesthetics, other medicines, foods, dyes, or preservatives  pregnant or trying to get pregnant  breast-feeding How should I use this medicine? Take this medicine by mouth with a glass of water. Follow the directions on the prescription label. You can take this medicine with or without food. Take your doses at regular intervals. Do not take your medicine more often than directed. Do not  stop taking this medicine suddenly. This may cause serious, heart-related side effects. If your doctor wants you to stop the medicine, the dose may be slowly lowered over time to avoid any side effects. Talk to your pediatrician regarding the use of this medicine in children. While this drug may be prescribed for children as young as 1 year of age for selected conditions, precautions do apply. Overdosage: If you think you have taken too much of this medicine contact a poison control center or emergency room at once. NOTE: This medicine is only for you. Do not share this medicine with others. What if I miss a dose? If you miss a dose, take it as soon as you can. If it is almost time for your next dose, take only that dose. Do not take double or extra doses. What may interact with this medicine? Do not take this medicine with any of the following medications:  amoxapine  arsenic trioxide  certain antibiotics like clarithromycin, erythromycin, gatifloxacin, gemifloxacin, levofloxacin, moxifloxacin, sparfloxacin, or troleandomycin  certain antidepressants called tricyclic antidepressants like amitriptyline, imipramine, or nortriptyline  certain medicines to control heart rhythm like disopyramide, encainide, moricizine, procainamide, propafenone, and quinidine  cisapride  delavirdine  droperidol  haloperidol  hawthorn  imatinib  levomethadyl  maprotiline  medicines for malaria like chloroquine and halofantrine  pentamidine  phenothiazines like chlorpromazine, mesoridazine, prochlorperazine, thioridazine  pimozide  quinine  ranolazine  ritonavir  sertindole This medicine may also interact with the following medications:  cimetidine  dofetilide  medicines for angina or high blood pressure  medicines to control heart rhythm like amiodarone  and digoxin  ziprasidone This list may not describe all possible interactions. Give your health care provider a list of all  the medicines, herbs, non-prescription drugs, or dietary supplements you use. Also tell them if you smoke, drink alcohol, or use illegal drugs. Some items may interact with your medicine. What should I watch for while using this medicine? Visit your doctor or health care professional for regular checks on your progress. Because your condition and the use of this medicine carries some risk, it is a good idea to carry an identification card, necklace or bracelet with details of your condition, medications and doctor or health care professional. Check your blood pressure and pulse rate regularly. Ask your health care professional what your blood pressure and pulse rate should be, and when you should contact him or her. Your doctor or health care professional also may schedule regular blood tests and electrocardiograms to check your progress. You may get drowsy or dizzy. Do not drive, use machinery, or do anything that needs mental alertness until you know how this medicine affects you. Do not stand or sit up quickly, especially if you are an older patient. This reduces the risk of dizzy or fainting spells. Alcohol can make you more dizzy, increase flushing and rapid heartbeats. Avoid alcoholic drinks. What side effects may I notice from receiving this medicine? Side effects that you should report to your doctor or health care professional as soon as possible:  chest pain, continued irregular heartbeats  difficulty breathing  swelling of the legs or feet  trembling, shaking  unusually weak or tired Side effects that usually do not require medical attention (report to your doctor or health care professional if they continue or are bothersome):  blurred vision  constipation  headache  nausea, vomiting  stomach pain This list may not describe all possible side effects. Call your doctor for medical advice about side effects. You may report side effects to FDA at 1-800-FDA-1088. Where should I  keep my medicine? Keep out of the reach of children. Store at room temperature between 15 and 30 degrees C (59 and 86 degrees F). Protect from light. Keep container tightly closed. Throw away any unused medicine after the expiration date. NOTE: This sheet is a summary. It may not cover all possible information. If you have questions about this medicine, talk to your doctor, pharmacist, or health care provider.  2020 Elsevier/Gold Standard (2018-01-23 11:41:38)  Diltiazem tablets What is this medicine? DILTIAZEM (dil TYE a zem) is a calcium-channel blocker. It affects the amount of calcium found in your heart and muscle cells. This relaxes your blood vessels, which can reduce the amount of work the heart has to do. This medicine is used to treat chest pain caused by angina. This medicine may be used for other purposes; ask your health care provider or pharmacist if you have questions. COMMON BRAND NAME(S): Cardizem What should I tell my health care provider before I take this medicine? They need to know if you have any of these conditions:  heart problems, low blood pressure, irregular heartbeat  liver disease  previous heart attack  an unusual or allergic reaction to diltiazem, other medicines, foods, dyes, or preservatives  pregnant or trying to get pregnant  breast-feeding How should I use this medicine? Take this medicine by mouth with a glass of water. Follow the directions on the prescription label. Do not cut, crush or chew this medicine. This medicine is usually taken before meals and at bedtime. Take your  doses at regular intervals. Do not take your medicine more often then directed. Do not stop taking except on the advice of your doctor or health care professional. Talk to your pediatrician regarding the use of this medicine in children. Special care may be needed. Overdosage: If you think you have taken too much of this medicine contact a poison control center or emergency room  at once. NOTE: This medicine is only for you. Do not share this medicine with others. What if I miss a dose? If you miss a dose, take it as soon as you can. If it is almost time for your next dose, take only that dose. Do not take double or extra doses. What may interact with this medicine? Do not take this medicine with any of the following:  cisapride  hawthorn  pimozide  ranolazine  red yeast rice This medicine may also interact with the following medications:  buspirone  carbamazepine  cimetidine  cyclosporine  digoxin  local anesthetics or general anesthetics  lovastatin  medicines for anxiety or difficulty sleeping like midazolam and triazolam  medicines for high blood pressure or heart problems  quinidine  rifampin, rifabutin, or rifapentine This list may not describe all possible interactions. Give your health care provider a list of all the medicines, herbs, non-prescription drugs, or dietary supplements you use. Also tell them if you smoke, drink alcohol, or use illegal drugs. Some items may interact with your medicine. What should I watch for while using this medicine? Check your blood pressure and pulse rate regularly. Ask your doctor or health care professional what your blood pressure and pulse rate should be and when you should contact him or her. You may feel dizzy or lightheaded. Do not drive, use machinery, or do anything that needs mental alertness until you know how this medicine affects you. To reduce the risk of dizzy or fainting spells, do not sit or stand up quickly, especially if you are an older patient. Alcohol can make you more dizzy or increase flushing and rapid heartbeats. Avoid alcoholic drinks. What side effects may I notice from receiving this medicine? Side effects that you should report to your doctor or health care professional as soon as possible:  allergic reactions like skin rash, itching or hives, swelling of the face, lips, or  tongue  confusion, mental depression  feeling faint or lightheaded, falls  pinpoint red spots on the skin  redness, blistering, peeling or loosening of the skin, including inside the mouth  slow, irregular heartbeat  swelling of the ankles, feet  unusual bleeding or bruising Side effects that usually do not require medical attention (report to your doctor or health care professional if they continue or are bothersome):  change in sex drive or performance  constipation or diarrhea  flushing of the face  headache  nausea, vomiting  tired or weak  trouble sleeping This list may not describe all possible side effects. Call your doctor for medical advice about side effects. You may report side effects to FDA at 1-800-FDA-1088. Where should I keep my medicine? Keep out of the reach of children. Store at room temperature between 20 and 25 degrees C (68 and 77 degrees F). Protect from light. Keep container tightly closed. Throw away any unused medicine after the expiration date. NOTE: This sheet is a summary. It may not cover all possible information. If you have questions about this medicine, talk to your doctor, pharmacist, or health care provider.  2020 Elsevier/Gold Standard (2013-01-15 10:54:31)

## 2018-11-15 ENCOUNTER — Other Ambulatory Visit: Payer: Self-pay

## 2018-11-15 ENCOUNTER — Ambulatory Visit (INDEPENDENT_AMBULATORY_CARE_PROVIDER_SITE_OTHER): Payer: PRIVATE HEALTH INSURANCE | Admitting: *Deleted

## 2018-11-15 VITALS — BP 128/70 | HR 82 | Ht 66.0 in | Wt 186.0 lb

## 2018-11-15 DIAGNOSIS — I456 Pre-excitation syndrome: Secondary | ICD-10-CM | POA: Diagnosis not present

## 2018-11-15 NOTE — Progress Notes (Signed)
Ekg reviewed by Dr Caryl Comes. No changes continue with current tx plan ./cy

## 2018-12-07 ENCOUNTER — Telehealth: Payer: Self-pay | Admitting: Cardiology

## 2018-12-07 NOTE — Telephone Encounter (Signed)
Left message informing pt to check her Mychart response and to call office back if she had further questions/concerns to discuss.

## 2018-12-07 NOTE — Telephone Encounter (Signed)
New Message   Needs to discuss dvt that the patient might have with Dr. Curt Bears. Please call.

## 2018-12-12 ENCOUNTER — Other Ambulatory Visit: Payer: Self-pay

## 2018-12-12 ENCOUNTER — Encounter (HOSPITAL_COMMUNITY): Payer: Self-pay | Admitting: Emergency Medicine

## 2018-12-12 ENCOUNTER — Emergency Department (HOSPITAL_BASED_OUTPATIENT_CLINIC_OR_DEPARTMENT_OTHER)
Admission: EM | Admit: 2018-12-12 | Discharge: 2018-12-12 | Disposition: A | Discharge status: Home / Self Care | Payer: PPO | Source: Home / Self Care | Attending: Emergency Medicine | Admitting: Emergency Medicine

## 2018-12-12 ENCOUNTER — Emergency Department (HOSPITAL_BASED_OUTPATIENT_CLINIC_OR_DEPARTMENT_OTHER): Payer: PPO

## 2018-12-12 ENCOUNTER — Encounter (HOSPITAL_BASED_OUTPATIENT_CLINIC_OR_DEPARTMENT_OTHER): Payer: Self-pay

## 2018-12-12 ENCOUNTER — Encounter (HOSPITAL_COMMUNITY): Payer: Self-pay

## 2018-12-12 ENCOUNTER — Emergency Department (HOSPITAL_COMMUNITY)
Admission: EM | Admit: 2018-12-12 | Discharge: 2018-12-12 | Discharge status: Against Medical Advice | Payer: PPO | Attending: Emergency Medicine | Admitting: Emergency Medicine

## 2018-12-12 ENCOUNTER — Ambulatory Visit (INDEPENDENT_AMBULATORY_CARE_PROVIDER_SITE_OTHER)
Admission: EM | Admit: 2018-12-12 | Discharge: 2018-12-12 | Disposition: A | Discharge status: Home / Self Care | Payer: PPO | Source: Home / Self Care | Attending: Emergency Medicine | Admitting: Emergency Medicine

## 2018-12-12 ENCOUNTER — Telehealth (HOSPITAL_COMMUNITY): Payer: Self-pay | Admitting: Emergency Medicine

## 2018-12-12 DIAGNOSIS — S8011XA Contusion of right lower leg, initial encounter: Secondary | ICD-10-CM | POA: Insufficient documentation

## 2018-12-12 DIAGNOSIS — I456 Pre-excitation syndrome: Secondary | ICD-10-CM | POA: Insufficient documentation

## 2018-12-12 DIAGNOSIS — X58XXXA Exposure to other specified factors, initial encounter: Secondary | ICD-10-CM | POA: Insufficient documentation

## 2018-12-12 DIAGNOSIS — M79661 Pain in right lower leg: Secondary | ICD-10-CM | POA: Insufficient documentation

## 2018-12-12 DIAGNOSIS — R079 Chest pain, unspecified: Secondary | ICD-10-CM

## 2018-12-12 DIAGNOSIS — Y939 Activity, unspecified: Secondary | ICD-10-CM | POA: Insufficient documentation

## 2018-12-12 DIAGNOSIS — M7751 Other enthesopathy of right foot: Secondary | ICD-10-CM

## 2018-12-12 DIAGNOSIS — Z5321 Procedure and treatment not carried out due to patient leaving prior to being seen by health care provider: Secondary | ICD-10-CM | POA: Insufficient documentation

## 2018-12-12 DIAGNOSIS — M79604 Pain in right leg: Secondary | ICD-10-CM

## 2018-12-12 DIAGNOSIS — M79671 Pain in right foot: Secondary | ICD-10-CM | POA: Diagnosis present

## 2018-12-12 DIAGNOSIS — Z79899 Other long term (current) drug therapy: Secondary | ICD-10-CM | POA: Insufficient documentation

## 2018-12-12 DIAGNOSIS — Y929 Unspecified place or not applicable: Secondary | ICD-10-CM | POA: Insufficient documentation

## 2018-12-12 DIAGNOSIS — Y999 Unspecified external cause status: Secondary | ICD-10-CM | POA: Insufficient documentation

## 2018-12-12 HISTORY — DX: Pre-excitation syndrome: I45.6

## 2018-12-12 LAB — COMPREHENSIVE METABOLIC PANEL
ALT: 18 U/L (ref 0–44)
AST: 31 U/L (ref 15–41)
Albumin: 4.1 g/dL (ref 3.5–5.0)
Alkaline Phosphatase: 56 U/L (ref 38–126)
Anion gap: 8 (ref 5–15)
BUN: 11 mg/dL (ref 6–20)
CO2: 25 mmol/L (ref 22–32)
Calcium: 9.2 mg/dL (ref 8.9–10.3)
Chloride: 104 mmol/L (ref 98–111)
Creatinine, Ser: 0.92 mg/dL (ref 0.44–1.00)
GFR calc Af Amer: >60 mL/min (ref >60)
GFR calc non Af Amer: >60 mL/min (ref >60)
Glucose, Bld: 83 mg/dL (ref 70–99)
Potassium: 4.1 mmol/L (ref 3.5–5.1)
Sodium: 137 mmol/L (ref 135–145)
Total Bilirubin: 0.5 mg/dL (ref 0.3–1.2)
Total Protein: 7.7 g/dL (ref 6.5–8.1)

## 2018-12-12 LAB — CBC WITH DIFFERENTIAL/PLATELET
Abs Immature Granulocytes: 0 10*3/uL (ref 0.00–0.07)
Basophils Absolute: 0.1 10*3/uL (ref 0.0–0.1)
Basophils Relative: 1 %
Eosinophils Absolute: 0.1 10*3/uL (ref 0.0–0.5)
Eosinophils Relative: 2 %
HCT: 40.9 % (ref 36.0–46.0)
Hemoglobin: 12.6 g/dL (ref 12.0–15.0)
Immature Granulocytes: 0 %
Lymphocytes Relative: 45 %
Lymphs Abs: 3 10*3/uL (ref 0.7–4.0)
MCH: 23 pg — ABNORMAL LOW (ref 26.0–34.0)
MCHC: 30.8 g/dL (ref 30.0–36.0)
MCV: 74.5 fL — ABNORMAL LOW (ref 80.0–100.0)
Monocytes Absolute: 0.5 10*3/uL (ref 0.1–1.0)
Monocytes Relative: 7 %
Neutro Abs: 3.1 10*3/uL (ref 1.7–7.7)
Neutrophils Relative %: 45 %
Platelets: 256 10*3/uL (ref 150–400)
RBC: 5.49 MIL/uL — ABNORMAL HIGH (ref 3.87–5.11)
RDW: 14.2 % (ref 11.5–15.5)
WBC: 6.8 10*3/uL (ref 4.0–10.5)
nRBC: 0 % (ref 0.0–0.2)

## 2018-12-12 LAB — TROPONIN I (HIGH SENSITIVITY): Troponin I (High Sensitivity): <2 ng/L (ref <18)

## 2018-12-12 LAB — PREGNANCY, URINE: Preg Test, Ur: NEGATIVE

## 2018-12-12 MED ORDER — IBUPROFEN 600 MG PO TABS: 600.0000 mg | ORAL_TABLET | Freq: Four times a day (QID) | ORAL | 0 refills | Status: DC | PRN

## 2018-12-12 NOTE — ED Triage Notes (Signed)
Pt c/o CP x today-also c/o right LE pain/bruising/swelling x 1 week-denies injury-NAD-steady gait

## 2018-12-12 NOTE — Telephone Encounter (Signed)
Pt called requesting we just "order the dvt study". Reviewed record with Dr. Alphonzo Cruise, pt is a cardiac pt and needs full workup in ER. Pt upset about the wait time, pt given wait times of other ERs.

## 2018-12-12 NOTE — ED Notes (Signed)
ED Provider at bedside. 

## 2018-12-12 NOTE — ED Provider Notes (Signed)
HPI  SUBJECTIVE:  Kaitlin Kim is a 28 y.o. female who presents with multiple complaints.  First, she reports lateral calf pain that radiates to her posterior right thigh for the past week.  She states that it is sharp, intermittent, lasting seconds.  Sometimes located along the medial thigh.  She denies calf swelling, lower extremity edema, exogenous estrogen, smoking, history of cancer, DVT, PE, hypercoagulability.  No surgery in the past 4 weeks, no recent immobilization, hemoptysis or shortness of breath.  She denies back or knee pain. [facility].  She was not thought to have a DVT at that time, but was instructed to seek further medical evaluation in the ED if her symptoms persisted.  Second, she reports left-sided chest pain that radiates through to her back.  It is mild, achy, starting this morning.  It lasts hours.  No nausea, diaphoresis, exertional component.  Is not affected with torso rotation or movement.  No coughing, wheezing, shortness of breath.  She has had symptoms like this before, states that massage made it better, no aggravating factors.  Third, she reports pain, swelling at the base of her right first metatarsal tarsal starting 2 days ago.  She denies increased temperature, hypersensitivity, trauma, bruising.  States that the skin is intact.  No other foot pain.  She has never had symptoms like this before.  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  No history of gout, hypercoagulability, dissection, MI, coronary disease, diabetes, hypertension, hypercholesterolemia, smoking.  Family history negative for DVT, PE, MI, gout.  LMP: Now.  PMD: None.  History reviewed. No pertinent past medical history.  Past Surgical History:  Procedure Laterality Date  . WISDOM TOOTH EXTRACTION  2014    Family History  Problem Relation Age of Onset  . Prostate cancer Paternal Grandfather     Social History   Tobacco Use  .  Smoking status: Never Smoker  . Smokeless tobacco: Never Used  Substance Use Topics  . Alcohol use: Yes    Comment: socially  . Drug use: Never    No current facility-administered medications for this encounter.   Current Outpatient Medications:  .  diltiazem (CARDIZEM CD) 120 MG 24 hr capsule, Take 1 capsule (120 mg total) by mouth daily., Disp: 30 capsule, Rfl: 3 .  flecainide (TAMBOCOR) 100 MG tablet, Take 1 tablet (100 mg total) by mouth 2 (two) times daily., Disp: 60 tablet, Rfl: 4 .  ibuprofen (ADVIL) 600 MG tablet, Take 1 tablet (600 mg total) by mouth every 6 (six) hours as needed., Disp: 30 tablet, Rfl: 0 .  Multiple Vitamin (MULTIVITAMIN) capsule, Take 1 capsule by mouth daily., Disp: , Rfl:  .  Omega-3 Fatty Acids (OMEGA-3 FISH OIL PO), Take 2 capsules by mouth daily., Disp: , Rfl:   No Known Allergies   ROS  As noted in HPI.   Physical Exam  BP 115/66 (BP Location: Left Arm)   Pulse 71   Temp 99.2 F (37.3 C) (Oral)   Resp 16   LMP 12/10/2018   SpO2 100%   Constitutional: Well developed, well nourished, no acute distress Eyes:  EOMI, conjunctiva normal bilaterally HENT: Normocephalic, atraumatic,mucus membranes moist Respiratory: Normal inspiratory effort, lungs clear bilaterally. Cardiovascular: Normal rate, regular rhythm, no murmurs rubs or gallops.  Positive left-sided reproducible anterior chest wall tenderness at the costochondral junctions.  No tenderness along the trapezius, neck, scapula.  RP 2+ and equal bilaterally.  Pain  is aggravated with any shoulder movement. GI: nondistended skin: No rash, skin intact Musculoskeletal: Calves symmetric, nontender, no erythema, edema, palpable cord.  Positive bruising anterior lateral right lower leg.  No tenderness over the ankle, knee, hip.  No tenderness over the entire thigh.  No bony or muscular tenderness of the back.  No pain with full hip PROM.  SLR negative bilaterally.  No rash, bruising. Foot: Tenderness  at the base of the first right metatarsal along the plantar aspect of the foot.  No tenderness dorsum of the foot.  Skin intact.  No bruising.  No erythema, tenderness over the MTP joint.  No pain with toe flexion extension.  Cap refill less than 2 seconds.  No tenderness over the rest of the foot.  No deformity. Neurologic: Alert & oriented x 3, no focal neuro deficits Psychiatric: Speech and behavior appropriate   ED Course   Medications - No data to display  No orders of the defined types were placed in this encounter.   No results found for this or any previous visit (from the past 24 hour(s)). No results found.  ED Clinical Impression  1. Right leg pain   2. Chest pain, unspecified type   3. Bursitis of right foot      ED Assessment/Plan  Outside records reviewed.  As noted in HPI.  1.  Leg pain.  Low suspicion for DVT, however I cannot find any other apparent cause for her symptoms.  Given that this is her second evaluation for this, and she was instructed to seek medical treatment for an ultrasound if her symptoms returned, sending down to the ED for more comprehensive evaluation, possible imaging.  2.  Foot pain.  Presentation consistent with a bursitis.  No evidence of gout, septic joint.  Seriously doubt fracture.  Tylenol/ibuprofen combined 3-4 times a day, wear soft, cushioning shoes, follow-up with podiatry in 1 week if no better with conservative treatment.  3.  Chest pain.  While she does have reproducible chest wall tenderness, she has no tenderness over the area where she states the pain radiates to.  Sending down for more comprehensive evaluation.  Dissection low in the differential.  Discussed rationale for transfer to the emergency department.  Feel that she is stable to go by private vehicle.  Her vitals are normal.  She agrees with plan.  Meds ordered this encounter  Medications  . ibuprofen (ADVIL) 600 MG tablet    Sig: Take 1 tablet (600 mg total) by  mouth every 6 (six) hours as needed.    Dispense:  30 tablet    Refill:  0    *This clinic note was created using Scientist, clinical (histocompatibility and immunogenetics). Therefore, there may be occasional mistakes despite careful proofreading.   ?    Domenick Gong, MD 12/12/18 1028

## 2018-12-12 NOTE — ED Triage Notes (Signed)
Pt was referred for right leg and foot pain. Pt went to [place] and they sent her here to rule out a DVT and bursitis.

## 2018-12-12 NOTE — ED Triage Notes (Signed)
Pt states 1 week ago she started having bruises in her right leg. Pt states she is having right sided groin pain on and off. Pt is having pain in her right calf on and off x 1 week.Pt states her right big toe is being swelling and painful x 2 days.

## 2018-12-12 NOTE — ED Notes (Signed)
Called pt name for vitals, no response.  

## 2018-12-12 NOTE — Discharge Instructions (Addendum)
Go to the [facility] ER to have your chest pain and leg pain further evaluated.  Let them know if your leg or chest pain gets worse.   I also think you have a bursitis of the foot.  take 600 mg of ibuprofen combined with 1 g of Tylenol, wear soft, cushioning shoes for the next week, and if not any better with conservative treatment, follow-up with [facility] on [address] in 1 week.

## 2018-12-12 NOTE — ED Provider Notes (Signed)
[facility] EMERGENCY DEPARTMENT Provider Note   CSN: [id number] Arrival date & time: 12/12/18  1436     History   Chief Complaint Chief Complaint  Patient presents with  . Chest Pain    HPI Kaitlin Kim is a 28 y.o. female.     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  Dull ache left side of chest and feels it some in back. No other symptoms with it, no dyspnea, no cough, no nausea/vomiting.  Not dizziness today but has felt it more recently. Started on diltiazem and flecainide by Dr. Raul Del on 9/20.  No palpitations or heart racing.  Sometimes changing positions helped with CP, sitting up seems to help more.  Not exertional/pleuritic. CP still dull ache, started around 945AM. 4/10  No known fam hx of heart disease, mom also with palpitations No smoking, occ etoh, no other drugs No birth control, no long trips car/airplane, no recent surgeries. No fam hx of DVT/PE.  Bruising RLE last Sunday 10/18, no trauma, went to urgent care today because having shooting pain radiating towards back of the leg started Sunday. 7/10 when pain occurs.  It is intermittent shooting pain.  Worried that it could be DVT Not on any blood thinners  Past Medical History:  Diagnosis Date  . WPW syndrome     Patient Active Problem List   Diagnosis Date Noted  . Chest pain 01/18/2018  . Cardiac murmur 01/18/2018  . Wolff-Parkinson-White (WPW) pattern seen on electrocardiography 01/18/2018    Past Surgical History:  Procedure Laterality Date  . WISDOM TOOTH EXTRACTION  2014     OB History   No obstetric history on file.      Home Medications    Prior to Admission medications   Medication Sig Start Date End Date Taking? Authorizing Provider  diltiazem (CARDIZEM CD) 120 MG 24 hr capsule Take 1 capsule (120 mg total) by  mouth daily. 10/30/18   Camnitz, Andree Coss, MD  flecainide (TAMBOCOR) 100 MG tablet Take 1 tablet (100 mg total) by mouth 2 (two) times daily. 10/30/18   Camnitz, Andree Coss, MD  ibuprofen (ADVIL) 600 MG tablet Take 1 tablet (600 mg total) by mouth every 6 (six) hours as needed. 12/12/18   Domenick Gong, MD  Multiple Vitamin (MULTIVITAMIN) capsule Take 1 capsule by mouth daily.    [provider]  Omega-3 Fatty Acids (OMEGA-3 FISH OIL PO) Take 2 capsules by mouth daily.    [provider]    Family History Family History  Problem Relation Age of Onset  . Prostate cancer Paternal Grandfather     Social History Social History   Tobacco Use  . Smoking status: Never Smoker  . Smokeless tobacco: Never Used  Substance Use Topics  . Alcohol use: Yes    Comment: occ  . Drug use: Never     Allergies   Patient has no known allergies.   Review of Systems Review of Systems  Constitutional: Negative for fever.  HENT: Negative for congestion and sore throat.   Eyes: Negative for visual disturbance.  Respiratory: Negative for cough and shortness of breath.   Cardiovascular: Positive for chest pain and leg swelling.  Gastrointestinal: Negative for abdominal pain, nausea and vomiting.  Genitourinary: Negative for difficulty urinating.  Musculoskeletal: Positive for arthralgias and myalgias. Negative for  back pain.  Skin: Positive for color change (bruise RLE). Negative for rash.  Neurological: Negative for syncope and headaches. Light-headedness: not today but has had more since starting medication.     Physical Exam Updated Vital Signs BP 113/76 (BP Location: Left Arm)   Pulse 70   Temp 98.4 F (36.9 C) (Oral)   Resp 16   Ht 5\' 6"  (1.676 m)   Wt 85.7 kg   LMP 12/10/2018 (Exact Date)   SpO2 100%   BMI 30.51 kg/m   Physical Exam Vitals signs and nursing note reviewed.  Constitutional:      General: She is not in acute distress.    Appearance: She is  well-developed. She is not diaphoretic.  HENT:     Head: Normocephalic and atraumatic.  Eyes:     Conjunctiva/sclera: Conjunctivae normal.  Neck:     Musculoskeletal: Normal range of motion.  Cardiovascular:     Rate and Rhythm: Normal rate and regular rhythm.     Heart sounds: Normal heart sounds. No murmur. No friction rub. No gallop.   Pulmonary:     Effort: Pulmonary effort is normal. No respiratory distress.     Breath sounds: Normal breath sounds. No wheezing or rales.  Abdominal:     General: There is no distension.     Palpations: Abdomen is soft.     Tenderness: There is no abdominal tenderness. There is no guarding.  Musculoskeletal:        General: No tenderness.  Skin:    General: Skin is warm and dry.     Findings: Ecchymosis (RLE lateral 3cm diameter) present. No erythema or rash.  Neurological:     Mental Status: She is alert and oriented to person, place, and time.      ED Treatments / Results  Labs (all labs ordered are listed, but only abnormal results are displayed) Labs Reviewed  CBC WITH DIFFERENTIAL/PLATELET - Abnormal; Notable for the following components:      Result Value   RBC 5.49 (*)    MCV 74.5 (*)    MCH 23.0 (*)    All other components within normal limits  PREGNANCY, URINE  COMPREHENSIVE METABOLIC PANEL  TROPONIN I (HIGH SENSITIVITY)    EKG EKG Interpretation  Date/Time:  Tuesday December 12 2018 14:42:14 EDT Ventricular Rate:  71 PR Interval:  166 QRS Duration: 82 QT Interval:  396 QTC Calculation: 430 R Axis:   82 Text Interpretation: Normal sinus rhythm with sinus arrhythmia Normal ECG Previously seen ventricular preexitation less evident today Reconfirmed by Alvira MondaySchlossman, Khylah Kendra ([id number]) on 12/12/2018 3:16:33 PM   Radiology No results found.  Procedures Procedures (including critical care time)  Medications Ordered in ED Medications - No data to display   Initial Impression / Assessment and Plan / ED Course  I have  reviewed the triage vital signs and the nursing notes.  Pertinent labs & imaging results that were available during my care of the patient were reviewed by me and considered in my medical decision making (see chart for details).        28yo female with history of WPW presents with concern for chest pain and right leg pain.  Has had similar CP episodes in the past. ECG without acute findings. CXR without pneumonia, pneumothorax.  Low clinical suspicion for DVT and US does not show signs of DVT.  CP similar to prior episodes no dyspnea, no tachypnea, no hypoxia, no tachycardia, doubt PE.  She does have chest wall tenderness  on exam, consider possible musculoskeletal component, however recommend continued follow up with PCP and her Cardiologist.  Leg pain may also be muscular spasm or atypical sciatica like symptoms.  Normal pulses, doubt acute arterial thrombus.  Patient discharged in stable condition with understanding of reasons to return.   Final Clinical Impressions(s) / ED Diagnoses   Final diagnoses:  Chest pain, unspecified type  Contusion of right lower extremity, initial encounter    ED Discharge Orders    None       Gareth Morgan, MD 12/14/18 1945

## 2019-01-07 ENCOUNTER — Other Ambulatory Visit: Payer: Self-pay

## 2019-01-07 DIAGNOSIS — Z20822 Contact with and (suspected) exposure to covid-19: Secondary | ICD-10-CM

## 2019-01-08 LAB — NOVEL CORONAVIRUS, NAA: SARS-CoV-2, NAA: NOT DETECTED

## 2019-01-16 ENCOUNTER — Other Ambulatory Visit: Payer: Self-pay

## 2019-01-16 DIAGNOSIS — Z20822 Contact with and (suspected) exposure to covid-19: Secondary | ICD-10-CM

## 2019-01-17 LAB — NOVEL CORONAVIRUS, NAA: SARS-CoV-2, NAA: DETECTED — AB

## 2019-01-18 ENCOUNTER — Telehealth: Payer: Self-pay | Admitting: Critical Care Medicine

## 2019-01-18 NOTE — Telephone Encounter (Signed)
I connected with this patient who tested + December 1 had a community mobile Covid testing event for Covid  This patient currently has headaches congestion cough but no shortness of breath she does have muscle aches and fatigue and low-grade fever.  She is not in a high risk group therefore does not qualify for the monoclonal antibody  I gave her supportive care directions and she needs to stay in isolation till December 11 she knows the health department may be in touch and she knows to go to the urgent care or emergency room if she worsens

## 2019-02-01 ENCOUNTER — Other Ambulatory Visit: Payer: Self-pay

## 2019-02-01 DIAGNOSIS — Z20822 Contact with and (suspected) exposure to covid-19: Secondary | ICD-10-CM

## 2019-02-02 LAB — NOVEL CORONAVIRUS, NAA: SARS-CoV-2, NAA: NOT DETECTED

## 2019-02-27 ENCOUNTER — Other Ambulatory Visit: Payer: Self-pay | Admitting: Cardiology

## 2019-03-06 ENCOUNTER — Ambulatory Visit (INDEPENDENT_AMBULATORY_CARE_PROVIDER_SITE_OTHER): Payer: PRIVATE HEALTH INSURANCE | Admitting: Cardiology

## 2019-03-06 ENCOUNTER — Other Ambulatory Visit: Payer: Self-pay

## 2019-03-06 ENCOUNTER — Encounter: Payer: Self-pay | Admitting: Cardiology

## 2019-03-06 VITALS — BP 128/76 | HR 86 | Ht 66.0 in | Wt 187.0 lb

## 2019-03-06 DIAGNOSIS — R011 Cardiac murmur, unspecified: Secondary | ICD-10-CM | POA: Diagnosis not present

## 2019-03-06 DIAGNOSIS — I456 Pre-excitation syndrome: Secondary | ICD-10-CM

## 2019-03-06 MED ORDER — FLECAINIDE ACETATE 100 MG PO TABS: 50.0000 mg | ORAL_TABLET | Freq: Two times a day (BID) | ORAL | 8 refills | Status: DC

## 2019-03-06 MED ORDER — FLECAINIDE ACETATE 50 MG PO TABS: 50.0000 mg | ORAL_TABLET | Freq: Two times a day (BID) | ORAL | 2 refills | Status: DC

## 2019-03-06 NOTE — Progress Notes (Signed)
Cardiology Office Note:    Date:  03/06/2019   ID:  Kaitlin Kim, DOB [birth year], MRN [id number]  PCP:  Patient, No Pcp Per  Cardiologist:  Garwin Brothers, MD   Referring MD: No ref. provider found    ASSESSMENT:    1. Wolff-Parkinson-White (WPW) pattern seen on electrocardiography    PLAN:    In order of problems listed above:  1. WPW on EKG: I discussed my findings with the patient at extensive length.  She has had no palpitations since.  She is happy about it.  Importance of primary prevention stressed and regular exercise stressed.  Weight reduction stressed.  I told her to cut down her flecainide to 50 mg twice a day.  She will have a Chem-7 today.  She will see my partner Dr. Elberta Fortis in 3 months in 6 months for follow-up with me.   Medication Adjustments/Labs and Tests Ordered: Current medicines are reviewed at length with the patient today.  Concerns regarding medicines are outlined above.  No orders of the defined types were placed in this encounter.  No orders of the defined types were placed in this encounter.    Chief Complaint  Patient presents with  . Follow-up    6 MO FU      History of Present Illness:    Kaitlin Kim is a 29 y.o. female.  Patient has past medical history of WPW on the EKG.  She saw Dr. Elberta Fortis our electrophysiology colleague for palpitations and was initiated on Cardizem and flecainide and she is doing well.  No chest pain orthopnea or PND.  She tells me that she has shooting pains in the legs from the back going down.  She tells me that she was tested for DVT in the past and was negative.  She is a Engineer, petroleum.  She is tested positive for Covid in December and has recovered from it.  At the time of my evaluation, the patient is alert awake oriented and in no distress.  Past Medical History:  Diagnosis Date  . WPW syndrome     Past Surgical History:  Procedure Laterality Date  . WISDOM TOOTH EXTRACTION  2014    Current  Medications: Current Meds  Medication Sig  . diltiazem (CARDIZEM CD) 120 MG 24 hr capsule TAKE 1 CAPSULE BY MOUTH EVERY DAY  . flecainide (TAMBOCOR) 100 MG tablet TAKE 1 TABLET BY MOUTH TWICE A DAY     Allergies:   Patient has no known allergies.   Social History   Socioeconomic History  . Marital status: Single    Spouse name: Not on file  . Number of children: Not on file  . Years of education: Not on file  . Highest education level: Not on file  Occupational History  . Not on file  Tobacco Use  . Smoking status: Never Smoker  . Smokeless tobacco: Never Used  Substance and Sexual Activity  . Alcohol use: Yes    Comment: occ  . Drug use: Never  . Sexual activity: Not on file  Other Topics Concern  . Not on file  Social History Narrative  . Not on file   Social Determinants of Health   Financial Resource Strain:   . Difficulty of Paying Living Expenses: Not on file  Food Insecurity:   . Worried About Programme researcher, broadcasting/film/video in the Last Year: Not on file  . Ran Out of Food in the Last Year: Not on file  Transportation Needs:   .  Lack of Transportation (Medical): Not on file  . Lack of Transportation (Non-Medical): Not on file  Physical Activity:   . Days of Exercise per Week: Not on file  . Minutes of Exercise per Session: Not on file  Stress:   . Feeling of Stress : Not on file  Social Connections:   . Frequency of Communication with Friends and Family: Not on file  . Frequency of Social Gatherings with Friends and Family: Not on file  . Attends Religious Services: Not on file  . Active Member of Clubs or Organizations: Not on file  . Attends Archivist Meetings: Not on file  . Marital Status: Not on file     Family History: The patient's family history includes Prostate cancer in her paternal grandfather.  ROS:   Please see the history of present illness.    All other systems reviewed and are negative.  EKGs/Labs/Other Studies Reviewed:    The  following studies were reviewed today: EKG reveals sinus rhythm and nonspecific ST-T changes   Recent Labs: 12/12/2018: ALT 18; BUN 11; Creatinine, Ser 0.92; Hemoglobin 12.6; Platelets 256; Potassium 4.1; Sodium 137  Recent Lipid Panel    Component Value Date/Time   CHOL 171 01/18/2018 1108   TRIG 52 01/18/2018 1108   HDL 57 01/18/2018 1108   CHOLHDL 3.0 01/18/2018 1108   LDLCALC 104 (H) 01/18/2018 1108    Physical Exam:    VS:  BP 128/76   Pulse 86   Ht 5\' 6"  (1.676 m)   Wt 187 lb (84.8 kg)   SpO2 98%   BMI 30.18 kg/m     Wt Readings from Last 3 Encounters:  03/06/19 187 lb (84.8 kg)  12/12/18 189 lb (85.7 kg)  12/12/18 183 lb (83 kg)     GEN: Patient is in no acute distress HEENT: Normal NECK: No JVD; No carotid bruits LYMPHATICS: No lymphadenopathy CARDIAC: Hear sounds regular, 2/6 systolic murmur at the apex. RESPIRATORY:  Clear to auscultation without rales, wheezing or rhonchi  ABDOMEN: Soft, non-tender, non-distended MUSCULOSKELETAL:  No edema; No deformity  SKIN: Warm and dry NEUROLOGIC:  Alert and oriented x 3 PSYCHIATRIC:  Normal affect   Signed, Jenean Lindau, MD  03/06/2019 10:55 AM    [place]

## 2019-03-06 NOTE — Patient Instructions (Addendum)
Medication Instructions:  Your physician has recommended you make the following change in your medication:   DECREASE flecainide to 50mg  (0.5 tablet) twice daily  *If you need a refill on your cardiac medications before your next appointment, please call your pharmacy*  Lab Work: Your physician recommends that you have a BMP drawn today  If you have labs (blood work) drawn today and your tests are completely normal, you will receive your results only by: MyChart Message (if you have MyChart) OR . A paper copy in the mail If you have any lab test that is abnormal or we need to change your treatment, we will call you to review the results.  Testing/Procedures: You had an EKG performed today  Follow-Up: At [facility], you and your health needs are our priority.  As part of our continuing mission to provide you with exceptional heart care, we have created designated Provider Care Teams.  These Care Teams include your primary Cardiologist (physician) and Advanced Practice Providers (APPs -  Physician Assistants and Nurse Practitioners) who all work together to provide you with the care you need, when you need it.  Your next appointment:   6 month(s)  The format for your next appointment:   In Person  Provider:   [facility], MD

## 2019-03-07 ENCOUNTER — Telehealth: Payer: Self-pay

## 2019-03-07 LAB — BASIC METABOLIC PANEL
BUN/Creatinine Ratio: 18 (ref 9–23)
BUN: 14 mg/dL (ref 6–20)
CO2: 24 mmol/L (ref 20–29)
Calcium: 9.8 mg/dL (ref 8.7–10.2)
Chloride: 103 mmol/L (ref 96–106)
Creatinine, Ser: 0.8 mg/dL (ref 0.57–1.00)
GFR calc Af Amer: 117 mL/min/{1.73_m2} (ref >59)
GFR calc non Af Amer: 101 mL/min/{1.73_m2} (ref >59)
Glucose: 75 mg/dL (ref 65–99)
Potassium: 4 mmol/L (ref 3.5–5.2)
Sodium: 139 mmol/L (ref 134–144)

## 2019-03-07 NOTE — Telephone Encounter (Signed)
Left message to call office for results.

## 2019-03-07 NOTE — Telephone Encounter (Signed)
-----   Message from Garwin Brothers, MD sent at 03/07/2019 10:04 AM EST ----- The results of the study is unremarkable. Please inform patient. I will discuss in detail at next appointment. Cc  primary care/referring physician Garwin Brothers, MD 03/07/2019 10:04 AM

## 2019-03-07 NOTE — Telephone Encounter (Signed)
Results relayed, no further questions. 

## 2019-03-14 ENCOUNTER — Telehealth (INDEPENDENT_AMBULATORY_CARE_PROVIDER_SITE_OTHER): Payer: PPO | Admitting: Family Medicine

## 2019-03-14 ENCOUNTER — Encounter: Payer: Self-pay | Admitting: Family Medicine

## 2019-03-14 VITALS — Ht 66.0 in | Wt 186.0 lb

## 2019-03-14 DIAGNOSIS — M79605 Pain in left leg: Secondary | ICD-10-CM | POA: Diagnosis not present

## 2019-03-14 DIAGNOSIS — M79604 Pain in right leg: Secondary | ICD-10-CM

## 2019-03-14 DIAGNOSIS — Z7689 Persons encountering health services in other specified circumstances: Secondary | ICD-10-CM

## 2019-03-14 DIAGNOSIS — R52 Pain, unspecified: Secondary | ICD-10-CM

## 2019-03-14 NOTE — Progress Notes (Signed)
Virtual Visit via Video Note  I connected with Kaitlin Kim on 03/14/19 at  2:00 PM EST by a video enabled telemedicine application and verified that I am speaking with the correct person using two identifiers. Location patient: home Location provider:  home office Persons participating in the virtual visit: patient, provider  I discussed the limitations of evaluation and management by telemedicine and the availability of in person appointments. The patient expressed understanding and agreed to proceed.  Chief Complaint  Patient presents with  . Pain    New patient c/o sharpe shooting pain in front and back of right leg come and go x 2 months     HPI: Kaitlin Kim is a 29 y.o. female who is new to our office and complains of a 3 mo h/o intermittent sharp shooting pain "like a shock" that goes from her foot or ankle up to her thigh. It started in her Rt anterior leg but has also occurred in Lt leg and can be anterior or posterior. Symptoms last a few seconds and can comes and go for a few minutes. These episodes have more recently been occurring 2x/wk. Most often when pt is standing for a while or walking, rather than seated or lying down. No weakness, numbness or tingling, edema, rash, associated low back pain. She denies foot or ankle pain. No leg pain other than during above episodes. She tried changing shoes without improvement.  No injury or trauma.   Past Medical History:  Diagnosis Date  . WPW syndrome     Past Surgical History:  Procedure Laterality Date  . WISDOM TOOTH EXTRACTION  2014    Family History  Problem Relation Age of Onset  . Prostate cancer Paternal Grandfather   . Healthy Mother   . Healthy Father     Social History   Tobacco Use  . Smoking status: Never Smoker  . Smokeless tobacco: Never Used  Substance Use Topics  . Alcohol use: Yes    Alcohol/week: 1.0 standard drinks    Types: 1 Glasses of wine per week    Comment: occ  . Drug use: Never      Current Outpatient Medications:  .  diltiazem (CARDIZEM CD) 120 MG 24 hr capsule, TAKE 1 CAPSULE BY MOUTH EVERY DAY, Disp: 30 capsule, Rfl: 8 .  flecainide (TAMBOCOR) 50 MG tablet, Take 1 tablet (50 mg total) by mouth 2 (two) times daily., Disp: 90 tablet, Rfl: 2 .  Multiple Vitamin (MULTIVITAMIN) capsule, Take 1 capsule by mouth daily., Disp: , Rfl:  .  ibuprofen (ADVIL) 600 MG tablet, Take 1 tablet (600 mg total) by mouth every 6 (six) hours as needed. (Patient not taking: Reported on 03/06/2019), Disp: 30 tablet, Rfl: 0 .  Omega-3 Fatty Acids (OMEGA-3 FISH OIL PO), Take 2 capsules by mouth daily., Disp: , Rfl:   No Known Allergies    ROS: See pertinent positives and negatives per HPI.   EXAM:  VITALS per patient if applicable: Ht 5\' 6"  (1.676 m)   Wt 186 lb (84.4 kg) Comment: per pt last week  BMI 30.02 kg/m    GENERAL: alert, oriented, appears well and in no acute distress  NECK: normal movements of the head and neck  LUNGS: on inspection no signs of respiratory distress, breathing rate appears normal, no obvious gross SOB, gasping or wheezing, no conversational dyspnea  CV: no obvious cyanosis  MS: moves all visible extremities without noticeable abnormality  PSYCH/NEURO: pleasant and cooperative, no obvious depression or anxiety,  speech and thought processing grossly intact   ASSESSMENT AND PLAN:  1. Encounter to establish care with new doctor 2. Pain in both lower extremities 3. Shooting pain - pain lasts seconds-minutes then resolves, starts at distal LE and moves proximally, no associated symptoms, intermittent x 3 mo - Ambulatory referral to Sports Medicine  - trial of ibuprofen 600mg  BID w food x 7 days Discussed plan and reviewed medications with patient, including risks, benefits, and potential side effects. Pt expressed understand. All questions answered.    I discussed the assessment and treatment plan with the patient. The patient was provided an  opportunity to ask questions and all were answered. The patient agreed with the plan and demonstrated an understanding of the instructions.   The patient was advised to call back or seek an in-person evaluation if the symptoms worsen or if the condition fails to improve as anticipated.   , DO

## 2019-03-16 ENCOUNTER — Ambulatory Visit: Payer: PRIVATE HEALTH INSURANCE | Admitting: Family Medicine

## 2019-03-21 ENCOUNTER — Ambulatory Visit: Payer: PPO | Admitting: Family Medicine

## 2019-03-21 ENCOUNTER — Other Ambulatory Visit: Payer: Self-pay

## 2019-03-21 ENCOUNTER — Ambulatory Visit (INDEPENDENT_AMBULATORY_CARE_PROVIDER_SITE_OTHER): Payer: PPO

## 2019-03-21 ENCOUNTER — Encounter: Payer: Self-pay | Admitting: Family Medicine

## 2019-03-21 VITALS — BP 106/72 | HR 94 | Ht 66.0 in | Wt 191.6 lb

## 2019-03-21 DIAGNOSIS — M25561 Pain in right knee: Secondary | ICD-10-CM | POA: Diagnosis not present

## 2019-03-21 NOTE — Patient Instructions (Addendum)
Thank you for coming in today. Get xray today.  Work on Patent examiner.  If not better could consider PT and Lumbar spine MRI.  Eventually Nerve conduction study and B12, Vit D workup.   Please perform the exercise program that we have prepared for you and gone over in detail on a daily basis.  In addition to the handout you were provided you can access your program through: [email or web address]   Your unique program code is: [email or web address]

## 2019-03-21 NOTE — Progress Notes (Signed)
Subjective:    I'm seeing this patient as a consultation for:  Dr. Barron Alvine. Note will be routed back to referring provider/PCP.  CC: B leg pain (R>L)  I, Kaitlin Kim, LAT, ATC, am serving as scribe for Dr. Clementeen Kim.  HPI: Pt is a 29 y/o female presenting w/ c/o 3 month history of shooting leg pain from her R foot to R thigh and can be either anterior or posterior.  She reports that this pain is intermittent in nature and will only last for a few seconds-minute when it occurs.  She denies any low back pain, numbness/tingling or weakness in her B LEs.  Aggravating factors include prolonged standing and walking.  Past medical history, Surgical history, Family history, Social history, Allergies, and medications have been entered into the medical record, reviewed.  History Wolff-Parkinson-White.  On flecainide and diltiazem. 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  Review of Systems: No fevers or chills.  Objective:    Vitals:   03/21/19 1555  BP: 106/72  Pulse: 94  SpO2: 99%   General: Well Developed, well nourished, and in no acute distress.  Neuro/Psych: Alert and oriented x3, extra-ocular muscles intact, able to move all 4 extremities, sensation grossly intact. Skin: Warm and dry, no rashes noted.  Respiratory: Not using accessory muscles, speaking in full sentences, trachea midline.  Cardiovascular: Pulses palpable, no extremity edema. Abdomen: Does not appear distended. MSK:  L-spine: Nontender to spinal midline. Normal lumbar motion. Mildly positive right-sided slump test. Lower extremity strength reflexes and sensation are equal normal throughout.  Right hip normal-appearing normal motion. Right knee normal-appearing normal motion nontender. Stable ligamentous exam. Intact strength. Right foot and ankle normal-appearing nontender normal motion.  Left knee foot and ankle normal-appearing nontender normal  motion normal strength.  Pulses cap refill and sensation intact bilateral lower extremities.  Lab and Radiology Results DG Lumbar Spine Complete  Result Date: 03/21/2019 CLINICAL DATA:  S1 radiculopathy. EXAM: LUMBAR SPINE - COMPLETE 4+ VIEW COMPARISON:  None. FINDINGS: There is a large amount of stool throughout the visualized colon. There appear to be 5 non rib-bearing lumbar type vertebral bodies. There is no acute displaced fracture. No dislocation. The disc heights are relatively well preserved. There are no significant degenerative changes. IMPRESSION: Negative. Electronically Signed   By: Katherine Mantle M.D.   On: 03/21/2019 22:59   I, Kaitlin Kim, personally (independently) visualized and performed the interpretation of the images attached in this note.   Impression and Recommendations:    Assessment and Plan: 29 y.o. female with intermittent shooting pain right leg. At this point etiology is unclear.  Differential includes lumbosacral radiculopathy, isolated femoral or sciatic nerve irritation, myalgia or other issue. Plan for x-ray L-spine and home exercise program focused on core strengthening.  If not improving would consider formal physical therapy, potential lumbar MRI, and or even nerve conduction study. Additionally would consider further rheumatologic work-up and work-up for B12 or vitamin D deficiency.  Patient will keep me updated via MyChart messages and will proceed with further evaluation as needed in the future..   Orders Placed This Encounter  Procedures  . DG Lumbar Spine Complete    Standing Status:   Future    Number of Occurrences:   1    Standing Expiration Date:   05/18/2020    Order Specific Question:   Reason for Exam (SYMPTOM  OR DIAGNOSIS REQUIRED)    Answer:   eval possible  S1 radicuopathy    Order Specific Question:   Is patient pregnant?    Answer:   No    Order Specific Question:   Preferred imaging location?    Answer:   [facility]     Order Specific Question:   Radiology Contrast Protocol - do NOT remove file path    Answer:   [email or web address]   No orders of the defined types were placed in this encounter.   Discussed warning signs or symptoms. Please see discharge instructions. Patient expresses understanding.   The above documentation has been reviewed and is accurate and complete Kaitlin Kim

## 2019-03-22 NOTE — Progress Notes (Signed)
X-ray lumbar spine looks normal

## 2019-05-18 ENCOUNTER — Other Ambulatory Visit: Payer: Self-pay

## 2019-05-18 ENCOUNTER — Encounter: Payer: Self-pay | Admitting: Cardiology

## 2019-05-18 ENCOUNTER — Ambulatory Visit (INDEPENDENT_AMBULATORY_CARE_PROVIDER_SITE_OTHER): Payer: PPO | Admitting: Cardiology

## 2019-05-18 VITALS — BP 110/68 | HR 84 | Ht 66.0 in | Wt 192.0 lb

## 2019-05-18 DIAGNOSIS — I456 Pre-excitation syndrome: Secondary | ICD-10-CM | POA: Diagnosis not present

## 2019-05-18 MED ORDER — FLECAINIDE ACETATE 100 MG PO TABS: 100.0000 mg | ORAL_TABLET | Freq: Two times a day (BID) | ORAL | 3 refills | Status: AC

## 2019-05-18 NOTE — Progress Notes (Signed)
Electrophysiology Office Note   Date:  05/18/2019   ID:  Kaitlin Kim, DOB 1990-04-17, MRN [id number]  PCP:  Kaitlin Nian, DO  Cardiologist:  Kaitlin Kim Primary Electrophysiologist:  Kaitlin Dada Meredith Leeds, MD    Chief Complaint: palpitatins   History of Present Illness: Kaitlin Kim is a 29 y.o. female who is being seen today for the evaluation of palpitations at the request of Kaitlin Kim Kaitlin Kim. Presenting today for electrophysiology evaluation.  She had previously been having episodes of chest pain.  ECG at the time showed evidence of ventricular preexcitation.  Today, denies symptoms of  chest pain, shortness of breath, orthopnea, PND, lower extremity edema, claudication, dizziness, presyncope, syncope, bleeding, or neurologic sequela. The patient is tolerating medications without difficulties.  She is started to have palpitations again.  Her palpitations occur at all times of the day.  Her flecainide dose was decreased at her last general cardiology visit to 50 mg.  She is also having some weakness and fatigue and sore gums that she is attributing to her diltiazem.  She would like to potentially have an ablation performed, but she is moving soon and would like to have it done closer to [facility].   Past Medical History:  Diagnosis Date  . WPW syndrome    Past Surgical History:  Procedure Laterality Date  . WISDOM TOOTH EXTRACTION  2014     Current Outpatient Medications  Medication Sig Dispense Refill  . diltiazem (CARDIZEM CD) 120 MG 24 hr capsule TAKE 1 CAPSULE BY MOUTH EVERY DAY 30 capsule 8  . flecainide (TAMBOCOR) 50 MG tablet Take 1 tablet (50 mg total) by mouth 2 (two) times daily. 90 tablet 2   No current facility-administered medications for this visit.    Allergies:   Patient has no known allergies.   Social History:  The patient  reports that she has never smoked. She has never used smokeless tobacco. She reports current alcohol use of about 1.0  standard drinks of alcohol per week. She reports that she does not use drugs.   Family History:  The patient's family history includes Healthy in her father and mother; Prostate cancer in her paternal grandfather.    ROS:  Please see the history of present illness.   Otherwise, review of systems is positive for none.   All other systems are reviewed and negative.   PHYSICAL EXAM: VS:  BP 110/68   Pulse 84   Ht 5\' 6"  (1.676 m)   Wt 192 lb (87.1 kg)   BMI 30.99 kg/m  , BMI Body mass index is 30.99 kg/m. GEN: Well nourished, well developed, in no acute distress  HEENT: normal  Neck: no JVD, carotid bruits, or masses Cardiac: RRR; no murmurs, rubs, or gallops,no edema  Respiratory:  clear to auscultation bilaterally, normal work of breathing GI: soft, nontender, nondistended, + BS MS: no deformity or atrophy  Skin: warm and dry Neuro:  Strength and sensation are intact Psych: euthymic mood, full affect  EKG:  EKG is ordered today. Personal review of the ekg ordered shows rhythm, rate 84, ventricular preexcitation   Recent Labs: 12/12/2018: ALT 18; Hemoglobin 12.6; Platelets 256 03/06/2019: BUN 14; Creatinine, Ser 0.80; Potassium 4.0; Sodium 139    Lipid Panel     Component Value Date/Time   CHOL 171 01/18/2018 1108   TRIG 52 01/18/2018 1108   HDL 57 01/18/2018 1108   CHOLHDL 3.0 01/18/2018 1108   LDLCALC 104 (H) 01/18/2018 1108     Wt  Readings from Last 3 Encounters:  05/18/19 192 lb (87.1 kg)  03/21/19 191 lb 9.6 oz (86.9 kg)  03/14/19 186 lb (84.4 kg)      Other studies Reviewed: Additional studies/ records that were reviewed today include: TTE 02/02/18  Review of the above records today demonstrates:  - Left ventricle: The cavity size was normal. Wall thickness was   normal. Systolic function was normal. The estimated ejection   fraction was in the range of 55% to 60%. Wall motion was normal;   there were no regional wall motion abnormalities. Left    ventricular diastolic function parameters were normal. - Pericardium, extracardiac: A trivial pericardial effusion was   identified.   ASSESSMENT AND PLAN:  1.  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  Pathway appears to be septal or posterior septal.  Her flecainide was decreased at her last general cardiology visit and she has had more frequent palpitations.  Due to that, we Christino Mcglinchey increase her flecainide back to 100 mg.  She is having symptoms of weakness and fatigue that she thinks is due to the diltiazem.  We Nazarene Bunning stop that medication at this time.  She is moving to the [facility] or [place] area in the future and would prefer to have ablation done at those facilities as she is moving soon.  We Rossy Virag work to find an electrophysiologist there.    Current medicines are reviewed at length with the patient today.   The patient does not have concerns regarding her medicines.  The following changes were made today: Increase flecainide, stop diltiazem  Labs/ tests ordered today include:  Orders Placed This Encounter  Procedures  . EKG 12-Lead     Disposition:   FU with Ari Engelbrecht 6 months  Signed, Wilmetta Speiser Jorja Loa, MD  05/18/2019 4:33 PM     [facility] [address] [place] [address] [phone] (office) [phone] (fax)

## 2019-05-18 NOTE — Patient Instructions (Signed)
Medication Instructions:  Your physician has recommended you make the following change in your medication:  1. INCREASE Flecainide to 100 mg twice daily 2. STOP Diltiazem  *If you need a refill on your cardiac medications before your next appointment, please call your pharmacy*   Lab Work: None ordered If you have labs (blood work) drawn today and your tests are completely normal, you will receive your results only by: Marland Kitchen MyChart Message (if you have MyChart) OR . A paper copy in the mail If you have any lab test that is abnormal or we need to change your treatment, we will call you to review the results.   Testing/Procedures: None ordered   Follow-Up: Your physician recommends that you schedule a follow-up appointment in: 1-2 weeks for nurse visit EKG. (the office will call you to arrange this)  At [facility], you and your health needs are our priority.  As part of our continuing mission to provide you with exceptional heart care, we have created designated Provider Care Teams.  These Care Teams include your primary Cardiologist (physician) and Advanced Practice Providers (APPs -  Physician Assistants and Nurse Practitioners) who all work together to provide you with the care you need, when you need it.  We recommend signing up for the patient portal called "MyChart".  Sign up information is provided on this After Visit Summary.  MyChart is used to connect with patients for Virtual Visits (Telemedicine).  Patients are able to view lab/test results, encounter notes, upcoming appointments, etc.  Non-urgent messages can be sent to your provider as well.   To learn more about what you can do with MyChart, go to [email or web address].    Your next appointment:   6 month(s)  The format for your next appointment:   In Person  Provider:   Loman Brooklyn, MD   Thank you for choosing [facility]!!   Dory Horn, RN [phone]    Other Instructions

## 2019-05-21 ENCOUNTER — Ambulatory Visit: Payer: PPO | Admitting: Cardiology

## 2019-06-04 ENCOUNTER — Ambulatory Visit: Payer: PRIVATE HEALTH INSURANCE | Admitting: Cardiology

## 2019-12-13 DIAGNOSIS — I456 Pre-excitation syndrome: Secondary | ICD-10-CM

## 2019-12-14 NOTE — Telephone Encounter (Signed)
Ok to send referral  

## 2021-06-27 IMAGING — DX DG LUMBAR SPINE COMPLETE 4+V
5 series · 5 of 5 positions shown · non-contrast
Comparison: None.

CLINICAL DATA: S1 radiculopathy.

EXAM:
LUMBAR SPINE - COMPLETE 4+ VIEW

[lumbar spine ap (1 of 3)]
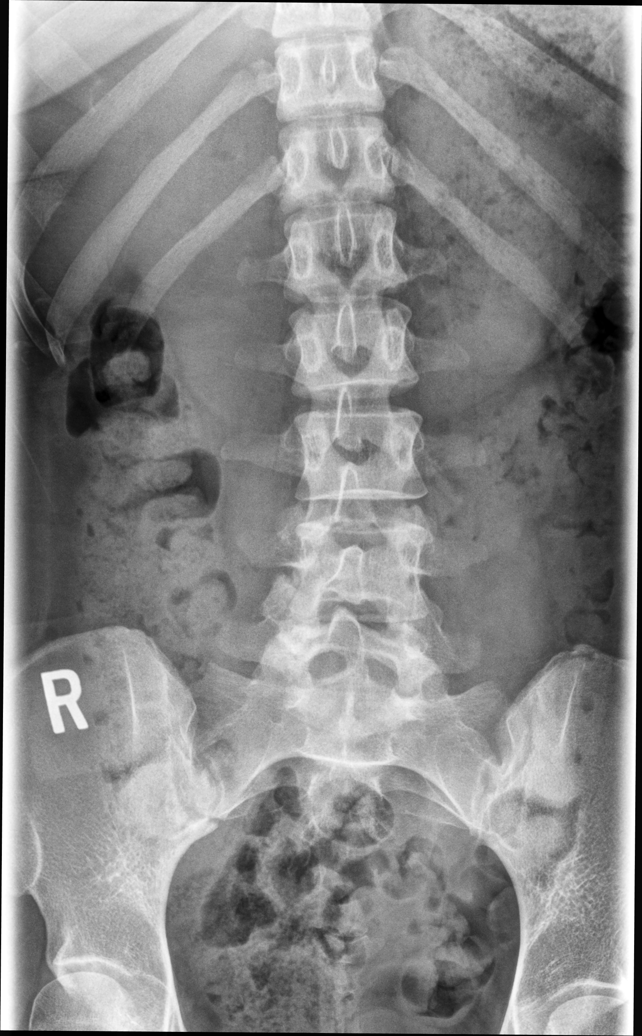

[lumbar spine lat (1 of 2)]
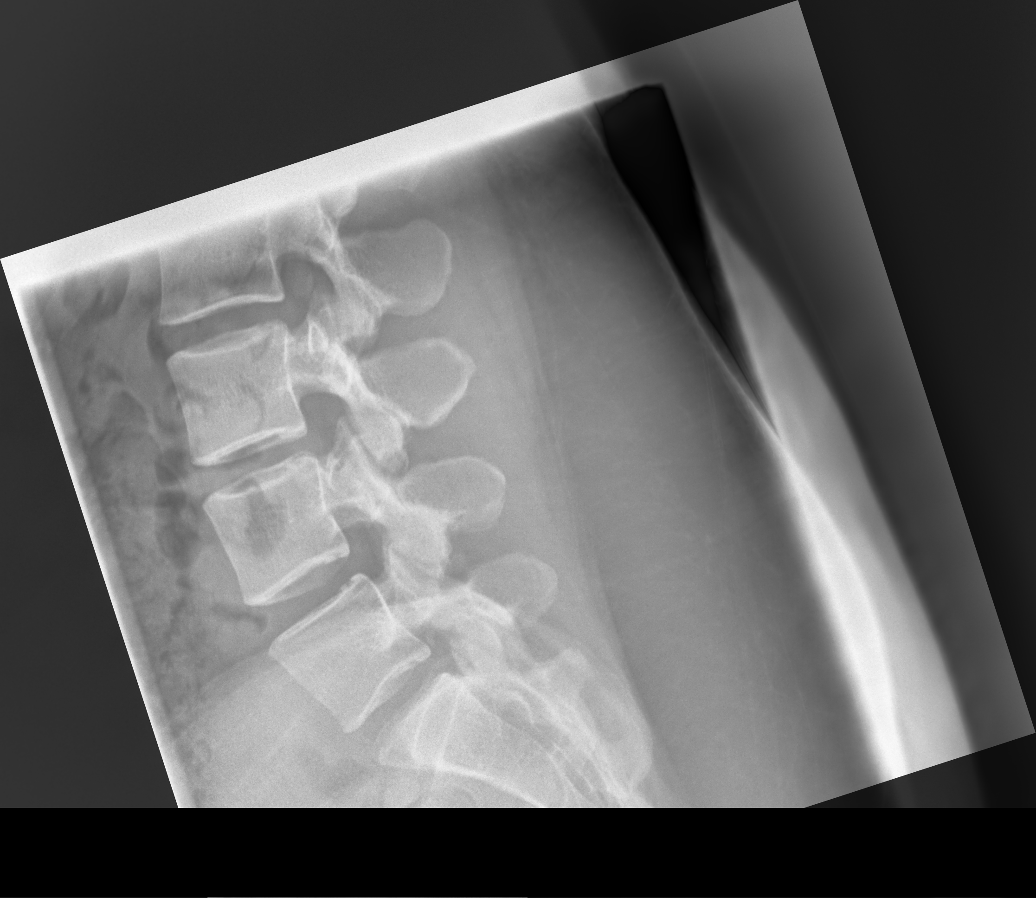

[lumbar spine ap (2 of 3)]
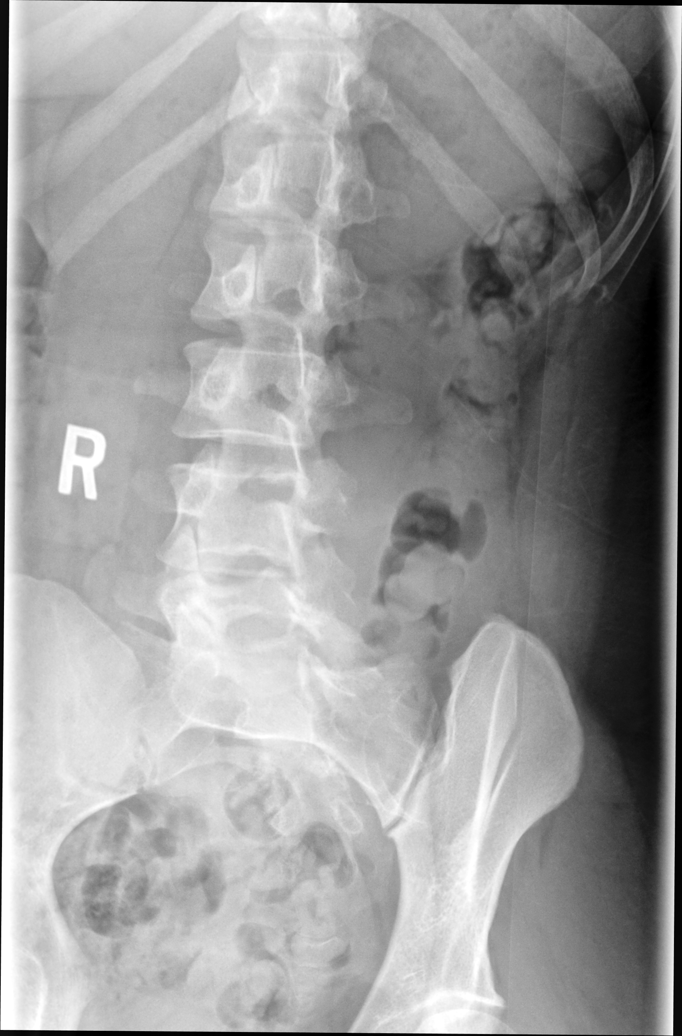

[lumbar spine ap (3 of 3)]
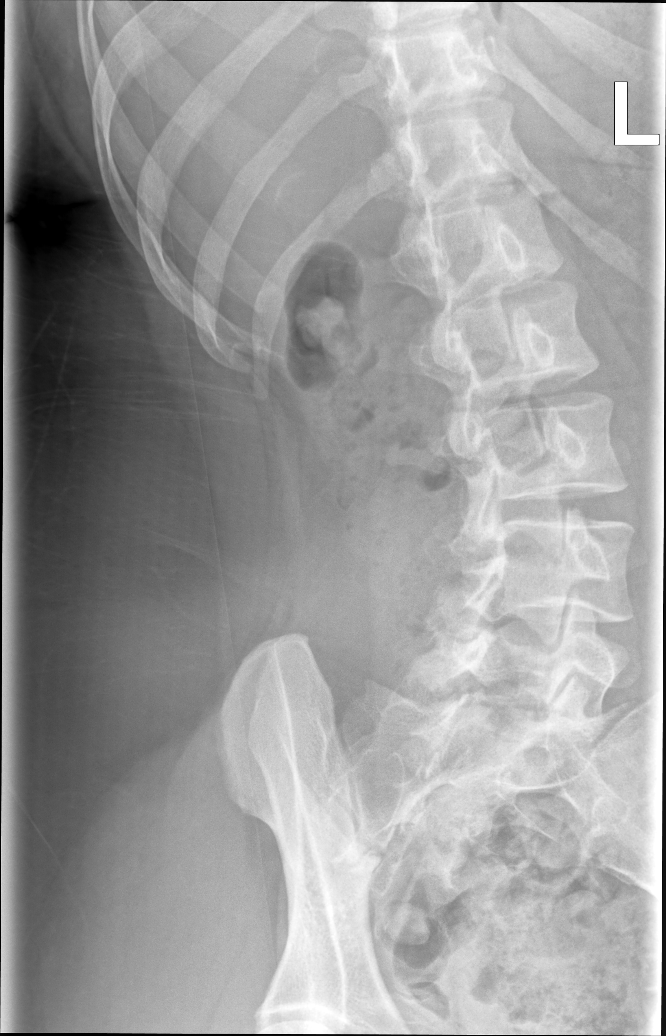

[lumbar spine lat (2 of 2)]
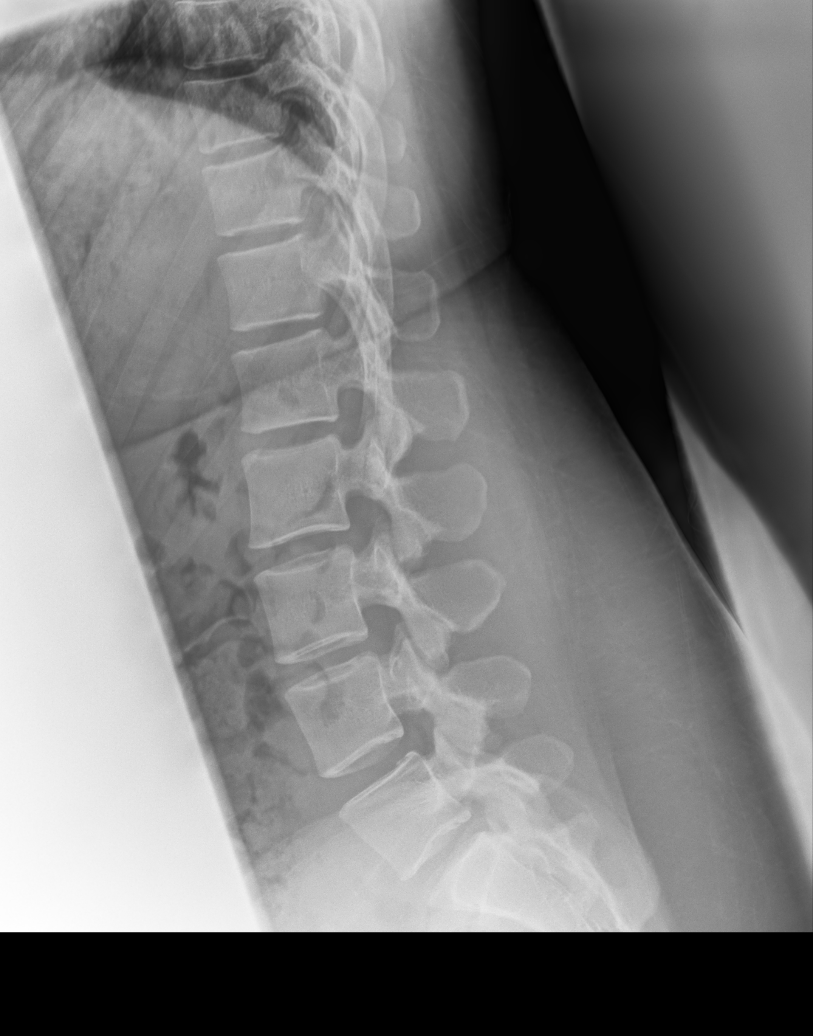

[5 of 5 positions shown; findings below may reference images not displayed]

FINDINGS: There is a large amount of stool throughout the visualized colon.
There appear to be 5 non rib-bearing lumbar type vertebral bodies.
There is no acute displaced fracture. No dislocation. The disc
heights are relatively well preserved. There are no significant
degenerative changes.
IMPRESSION: Negative.
